# Patient Record
Sex: Male | Born: 1940 | Race: White | Hispanic: No | Marital: Married | Smoking: Former smoker
Health system: Southern US, Community
[De-identification: ages and names within clinical notes are randomized; demographics above are authoritative.]

## PROBLEM LIST (undated history)

## (undated) DIAGNOSIS — C679 Malignant neoplasm of bladder, unspecified: Secondary | ICD-10-CM

## (undated) DIAGNOSIS — M47817 Spondylosis without myelopathy or radiculopathy, lumbosacral region: Secondary | ICD-10-CM

## (undated) DIAGNOSIS — R000 Tachycardia, unspecified: Secondary | ICD-10-CM

## (undated) DIAGNOSIS — K589 Irritable bowel syndrome without diarrhea: Secondary | ICD-10-CM

## (undated) DIAGNOSIS — M51379 Other intervertebral disc degeneration, lumbosacral region without mention of lumbar back pain or lower extremity pain: Secondary | ICD-10-CM

## (undated) DIAGNOSIS — J449 Chronic obstructive pulmonary disease, unspecified: Secondary | ICD-10-CM

## (undated) DIAGNOSIS — C3490 Malignant neoplasm of unspecified part of unspecified bronchus or lung: Secondary | ICD-10-CM

## (undated) DIAGNOSIS — L409 Psoriasis, unspecified: Secondary | ICD-10-CM

## (undated) DIAGNOSIS — C4442 Squamous cell carcinoma of skin of scalp and neck: Secondary | ICD-10-CM

## (undated) DIAGNOSIS — M5020 Other cervical disc displacement, unspecified cervical region: Secondary | ICD-10-CM

## (undated) DIAGNOSIS — M5137 Other intervertebral disc degeneration, lumbosacral region: Secondary | ICD-10-CM

## (undated) DIAGNOSIS — I10 Essential (primary) hypertension: Secondary | ICD-10-CM

## (undated) DIAGNOSIS — F419 Anxiety disorder, unspecified: Secondary | ICD-10-CM

## (undated) HISTORY — DX: Other intervertebral disc degeneration, lumbosacral region: M51.37

## (undated) HISTORY — PX: SHOULDER SURGERY: SHX246

## (undated) HISTORY — DX: Psoriasis, unspecified: L40.9

## (undated) HISTORY — PX: TONSILLECTOMY: SUR1361

## (undated) HISTORY — DX: Irritable bowel syndrome, unspecified: K58.9

## (undated) HISTORY — DX: Squamous cell carcinoma of skin of scalp and neck: C44.42

## (undated) HISTORY — PX: ROTATOR CUFF REPAIR: SHX139

## (undated) HISTORY — DX: Chronic obstructive pulmonary disease, unspecified: J44.9

## (undated) HISTORY — DX: Anxiety disorder, unspecified: F41.9

## (undated) HISTORY — DX: Other cervical disc displacement, unspecified cervical region: M50.20

## (undated) HISTORY — DX: Essential (primary) hypertension: I10

## (undated) HISTORY — PX: HIP FRACTURE SURGERY: SHX118

## (undated) HISTORY — PX: ABDOMINAL AORTIC ANEURYSM REPAIR: SUR1152

## (undated) HISTORY — DX: Tachycardia, unspecified: R00.0

## (undated) HISTORY — DX: Malignant neoplasm of unspecified part of unspecified bronchus or lung: C34.90

## (undated) HISTORY — DX: Malignant neoplasm of bladder, unspecified: C67.9

## (undated) HISTORY — DX: Spondylosis without myelopathy or radiculopathy, lumbosacral region: M47.817

## (undated) HISTORY — DX: Other intervertebral disc degeneration, lumbosacral region without mention of lumbar back pain or lower extremity pain: M51.379

## (undated) HISTORY — PX: APPENDECTOMY: SHX54

---

## 1999-11-01 ENCOUNTER — Other Ambulatory Visit: Admission: RE | Admit: 1999-11-01 | Discharge: 1999-11-01 | Payer: Self-pay | Admitting: Orthopedic Surgery

## 2000-04-13 ENCOUNTER — Encounter: Payer: Self-pay | Admitting: Orthopedic Surgery

## 2000-04-13 ENCOUNTER — Ambulatory Visit (HOSPITAL_COMMUNITY): Admission: RE | Admit: 2000-04-13 | Discharge: 2000-04-13 | Payer: Self-pay | Admitting: Orthopedic Surgery

## 2000-05-07 ENCOUNTER — Encounter: Payer: Self-pay | Admitting: Specialist

## 2000-05-07 ENCOUNTER — Ambulatory Visit (HOSPITAL_COMMUNITY): Admission: RE | Admit: 2000-05-07 | Discharge: 2000-05-07 | Payer: Self-pay | Admitting: Specialist

## 2000-05-13 ENCOUNTER — Emergency Department (HOSPITAL_COMMUNITY): Admission: EM | Admit: 2000-05-13 | Discharge: 2000-05-13 | Payer: Self-pay | Admitting: Emergency Medicine

## 2000-06-22 ENCOUNTER — Encounter: Payer: Self-pay | Admitting: Orthopedic Surgery

## 2000-06-24 ENCOUNTER — Encounter: Payer: Self-pay | Admitting: Specialist

## 2000-06-24 ENCOUNTER — Encounter (INDEPENDENT_AMBULATORY_CARE_PROVIDER_SITE_OTHER): Payer: Self-pay

## 2000-06-24 ENCOUNTER — Observation Stay (HOSPITAL_COMMUNITY): Admission: RE | Admit: 2000-06-24 | Discharge: 2000-06-25 | Payer: Self-pay | Admitting: Specialist

## 2005-05-26 ENCOUNTER — Emergency Department (HOSPITAL_COMMUNITY): Admission: EM | Admit: 2005-05-26 | Discharge: 2005-05-27 | Payer: Self-pay | Admitting: Emergency Medicine

## 2008-01-05 ENCOUNTER — Ambulatory Visit: Payer: Self-pay | Admitting: Vascular Surgery

## 2008-01-26 ENCOUNTER — Encounter: Admission: RE | Admit: 2008-01-26 | Discharge: 2008-01-26 | Payer: Self-pay | Admitting: Vascular Surgery

## 2008-01-26 ENCOUNTER — Ambulatory Visit: Payer: Self-pay | Admitting: Vascular Surgery

## 2008-02-10 ENCOUNTER — Inpatient Hospital Stay (HOSPITAL_COMMUNITY): Admission: RE | Admit: 2008-02-10 | Discharge: 2008-02-11 | Payer: Self-pay | Admitting: Vascular Surgery

## 2008-02-10 ENCOUNTER — Ambulatory Visit: Payer: Self-pay | Admitting: Vascular Surgery

## 2008-02-11 ENCOUNTER — Encounter: Payer: Self-pay | Admitting: Vascular Surgery

## 2008-03-08 ENCOUNTER — Encounter: Admission: RE | Admit: 2008-03-08 | Discharge: 2008-03-08 | Payer: Self-pay | Admitting: Vascular Surgery

## 2008-03-08 ENCOUNTER — Ambulatory Visit: Payer: Self-pay | Admitting: Vascular Surgery

## 2008-04-03 ENCOUNTER — Encounter (INDEPENDENT_AMBULATORY_CARE_PROVIDER_SITE_OTHER): Payer: Self-pay | Admitting: Urology

## 2008-04-03 ENCOUNTER — Ambulatory Visit (HOSPITAL_BASED_OUTPATIENT_CLINIC_OR_DEPARTMENT_OTHER): Admission: RE | Admit: 2008-04-03 | Discharge: 2008-04-03 | Payer: Self-pay | Admitting: Urology

## 2008-05-10 ENCOUNTER — Ambulatory Visit: Payer: Self-pay | Admitting: Vascular Surgery

## 2008-05-10 ENCOUNTER — Encounter: Admission: RE | Admit: 2008-05-10 | Discharge: 2008-05-10 | Payer: Self-pay | Admitting: Vascular Surgery

## 2008-07-19 ENCOUNTER — Ambulatory Visit (HOSPITAL_COMMUNITY): Admission: RE | Admit: 2008-07-19 | Discharge: 2008-07-19 | Payer: Self-pay | Admitting: Chiropractic Medicine

## 2008-08-23 ENCOUNTER — Encounter: Admission: RE | Admit: 2008-08-23 | Discharge: 2008-08-23 | Payer: Self-pay | Admitting: Vascular Surgery

## 2009-08-22 ENCOUNTER — Ambulatory Visit: Payer: Self-pay | Admitting: Vascular Surgery

## 2009-08-22 ENCOUNTER — Encounter: Admission: RE | Admit: 2009-08-22 | Discharge: 2009-08-22 | Payer: Self-pay | Admitting: Vascular Surgery

## 2009-10-08 ENCOUNTER — Encounter: Payer: Self-pay | Admitting: Pulmonary Disease

## 2009-10-19 DIAGNOSIS — J329 Chronic sinusitis, unspecified: Secondary | ICD-10-CM | POA: Insufficient documentation

## 2009-10-19 DIAGNOSIS — C679 Malignant neoplasm of bladder, unspecified: Secondary | ICD-10-CM | POA: Insufficient documentation

## 2009-10-19 DIAGNOSIS — F411 Generalized anxiety disorder: Secondary | ICD-10-CM | POA: Insufficient documentation

## 2009-10-19 DIAGNOSIS — R222 Localized swelling, mass and lump, trunk: Secondary | ICD-10-CM | POA: Insufficient documentation

## 2009-10-19 DIAGNOSIS — G2581 Restless legs syndrome: Secondary | ICD-10-CM | POA: Insufficient documentation

## 2009-10-19 DIAGNOSIS — M5030 Other cervical disc degeneration, unspecified cervical region: Secondary | ICD-10-CM

## 2009-10-22 ENCOUNTER — Ambulatory Visit: Payer: Self-pay | Admitting: Pulmonary Disease

## 2009-10-31 ENCOUNTER — Ambulatory Visit: Payer: Self-pay | Admitting: Pulmonary Disease

## 2009-10-31 ENCOUNTER — Ambulatory Visit: Admission: RE | Admit: 2009-10-31 | Discharge: 2009-10-31 | Payer: Self-pay | Admitting: Pulmonary Disease

## 2009-11-02 ENCOUNTER — Ambulatory Visit (HOSPITAL_COMMUNITY): Admission: RE | Admit: 2009-11-02 | Discharge: 2009-11-02 | Payer: Self-pay | Admitting: Pulmonary Disease

## 2009-11-02 ENCOUNTER — Ambulatory Visit: Payer: Self-pay | Admitting: Pulmonary Disease

## 2009-11-02 DIAGNOSIS — C3410 Malignant neoplasm of upper lobe, unspecified bronchus or lung: Secondary | ICD-10-CM | POA: Insufficient documentation

## 2009-11-08 ENCOUNTER — Ambulatory Visit: Payer: Self-pay | Admitting: Pulmonary Disease

## 2009-11-08 DIAGNOSIS — J439 Emphysema, unspecified: Secondary | ICD-10-CM | POA: Insufficient documentation

## 2009-11-09 ENCOUNTER — Ambulatory Visit: Payer: Self-pay | Admitting: Internal Medicine

## 2009-11-15 ENCOUNTER — Ambulatory Visit: Payer: Self-pay | Admitting: Internal Medicine

## 2009-11-15 ENCOUNTER — Ambulatory Visit: Payer: Self-pay | Admitting: Thoracic Surgery (Cardiothoracic Vascular Surgery)

## 2009-11-16 ENCOUNTER — Telehealth (INDEPENDENT_AMBULATORY_CARE_PROVIDER_SITE_OTHER): Payer: Self-pay | Admitting: *Deleted

## 2009-12-05 ENCOUNTER — Telehealth: Payer: Self-pay | Admitting: Pulmonary Disease

## 2009-12-11 IMAGING — CT CT ANGIO PELVIS
2 of 6 series · 16 of 46 positions shown, 18 images · IV contrast ([ID] [id number])
Comparison: The CT abdomen pelvis of 01/26/2008

CTA ABDOMEN

CLINICAL DATA: Post AAA stent placement 3 weeks ago, follow-up

CT ANGIOGRAPHY ABDOMEN AND PELVIS
TECHNIQUE: Multidetector CT imaging of the abdomen and pelvis was
performed using the standard protocol during bolus administration
of intravenous contrast. Multiplanar reconstructed images including
MIPs were obtained and reviewed to evaluate the vascular anatomy.
Contrast: 100 ml [id number]

[Series 5: angio · axial · 0.72mm/px · z∈[-419,-64]mm · 13 of 211 slices shown, 15 images]
[im 11/211  soft-tissue]
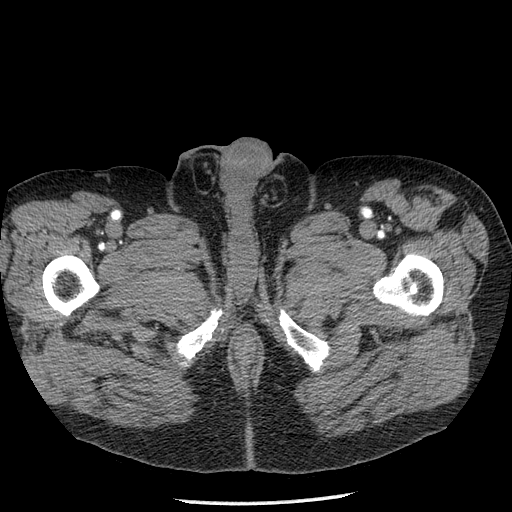
[im 11/211  bone]
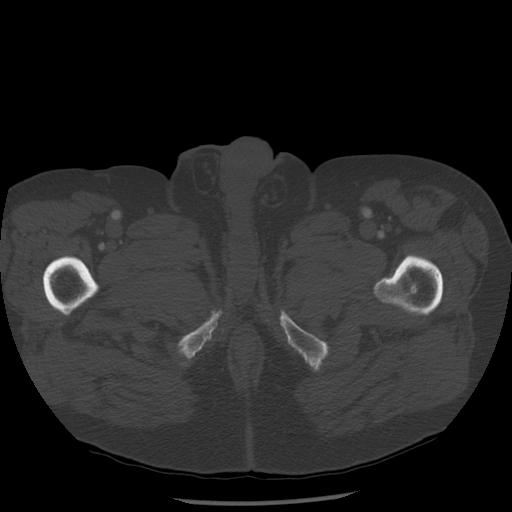
[im 32/211  soft-tissue]
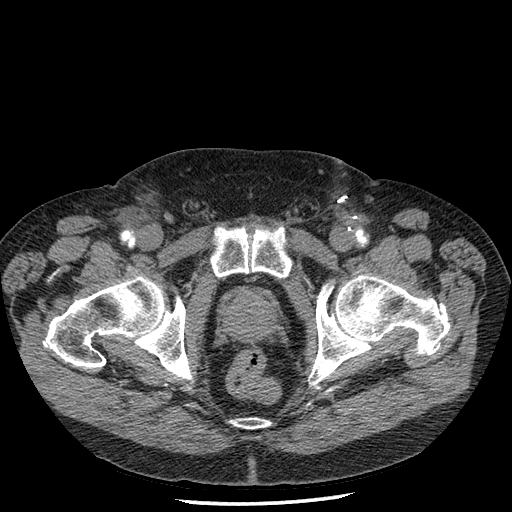
[im 43/211  soft-tissue]
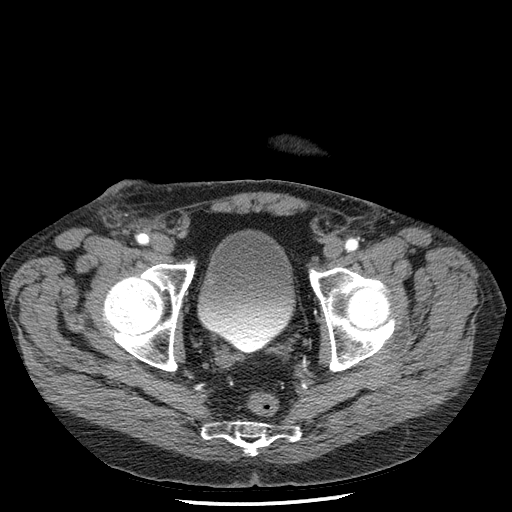
[im 64/211  soft-tissue]
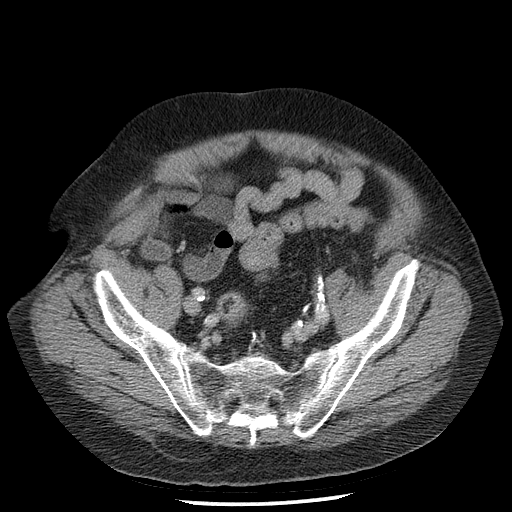
[im 74/211  soft-tissue]
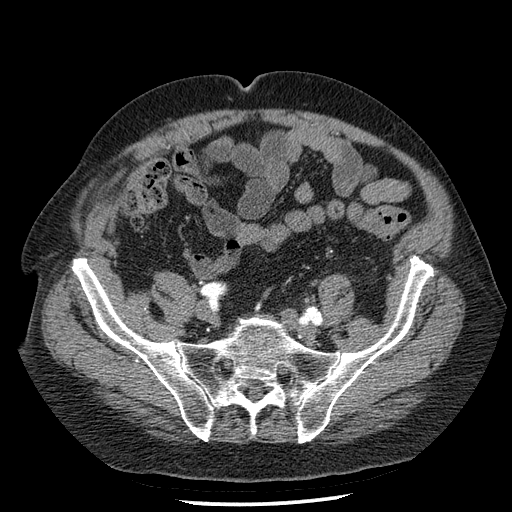
[im 95/211  soft-tissue]
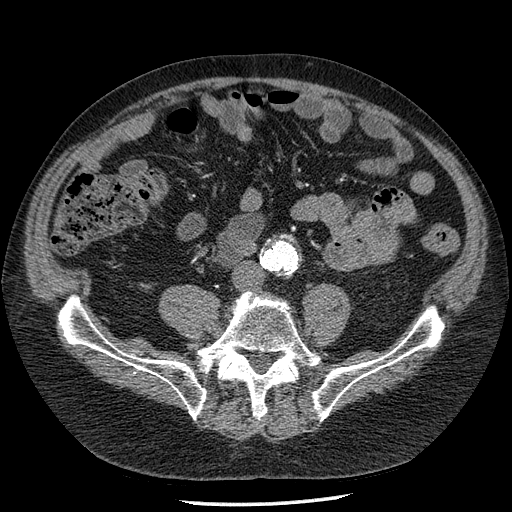
[im 106/211  soft-tissue]
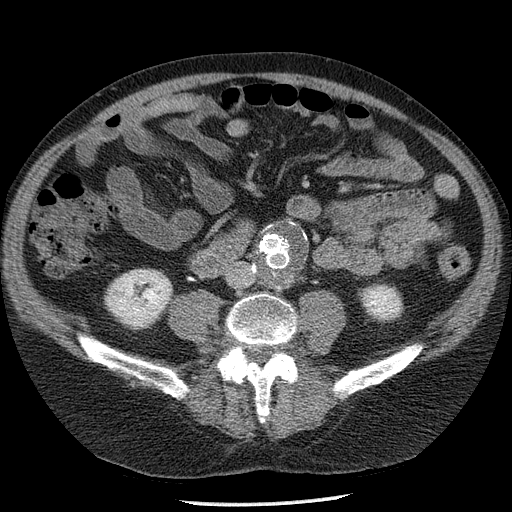
[im 116/211  soft-tissue]
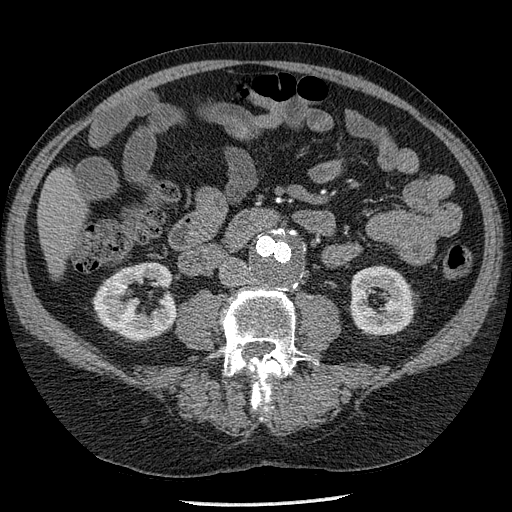
[im 137/211  soft-tissue]
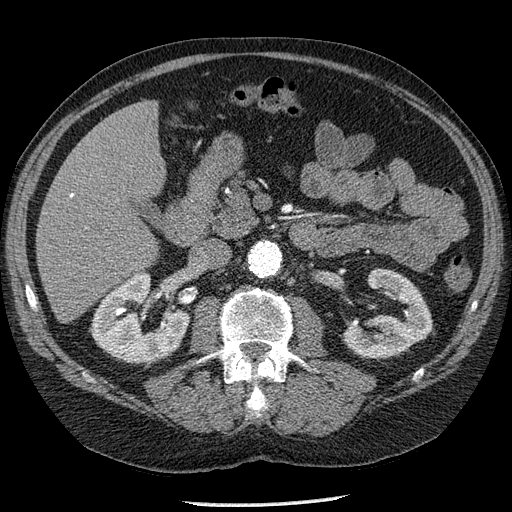
[im 137/211  bone]
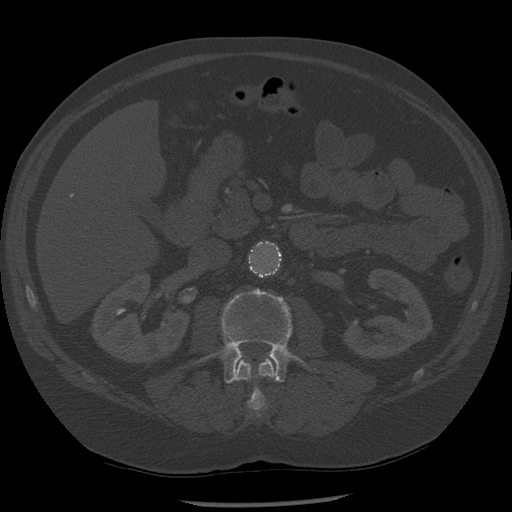
[im 148/211  soft-tissue]
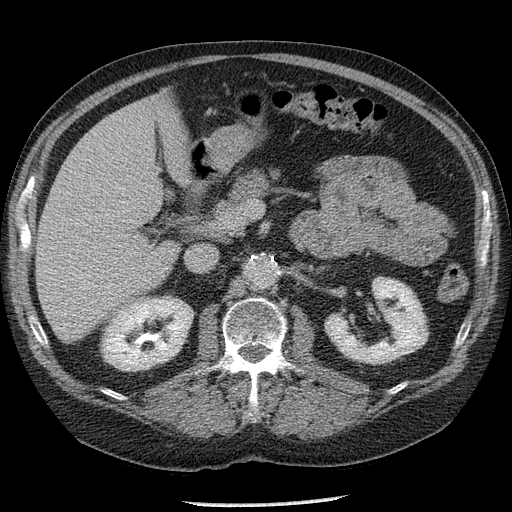
[im 169/211  soft-tissue]
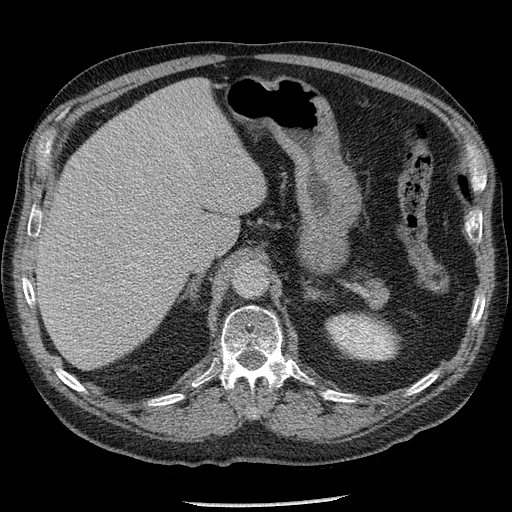
[im 179/211  soft-tissue]
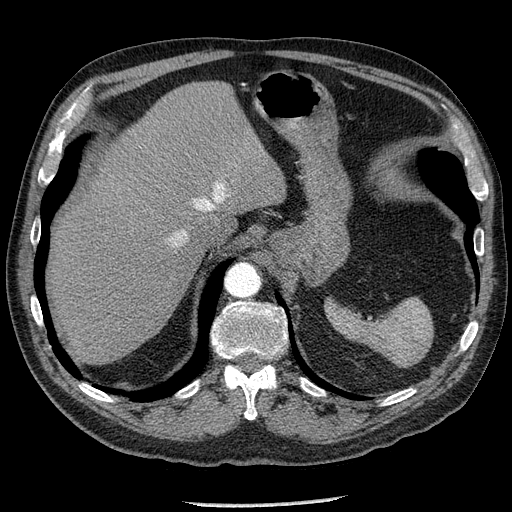
[im 200/211  soft-tissue]
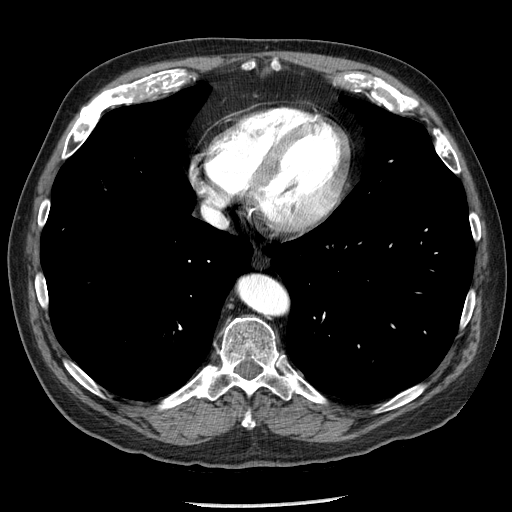

[Series 602: sagittal body · sagittal · 0.80mm/px · 3 of 149 slices shown]
[im 50/149  soft-tissue]
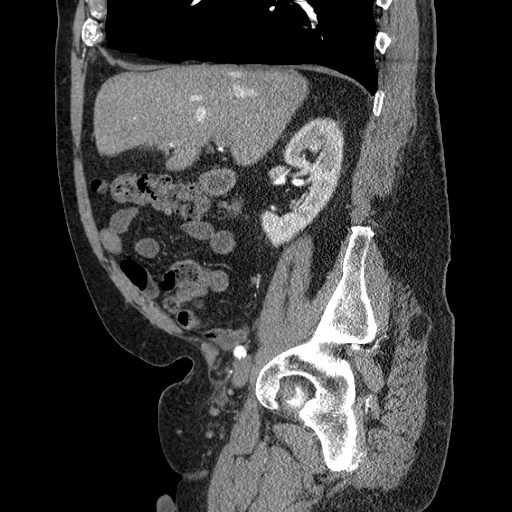
[im 66/149  soft-tissue]
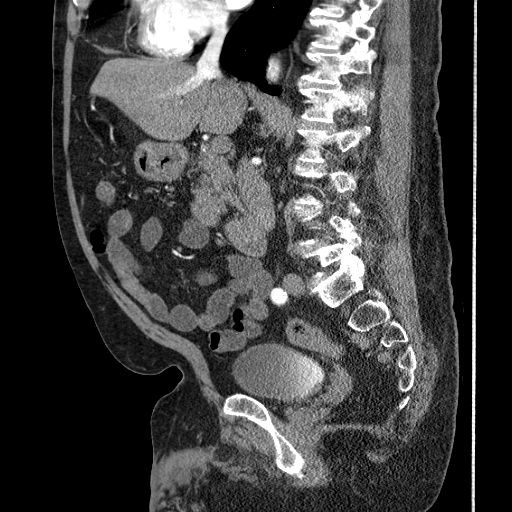
[im 83/149  soft-tissue]
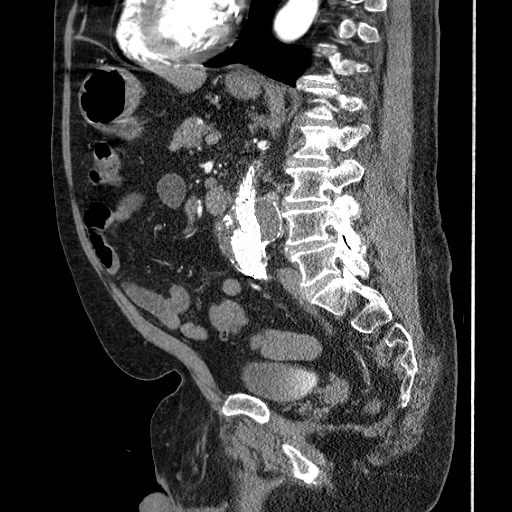

[16 of 46 positions shown; findings below may reference images not displayed]

FINDINGS: The lung bases remain clear, with minimal pleural
plaques at the right lung base abutting the hemidiaphragm
unchanged.  On the unenhanced study the AAA stent appears to be in
good position, with the proximal portion extending to a point just
beneath the origins of the renal arteries.  As noted on the prior
study there is a subtle soft tissue lesion within the right urinary
bladder worrisome for bladder carcinoma.

The abdominal aortic aneurysm sac is unchanged measuring 50 x 41
mm.  The lumen of the AAA stent opacifies with no evidence of
thrombus. On delayed images there is no evidence of endovascular
leak.

The liver enhances with no focal abnormality and no ductal
dilatation is seen.  No calcified gallstones are noted.  The
pancreas is stable in size as are the adrenal glands and the
spleen.  The kidneys enhance and the pelvocaliceal systems appear
normal.
IMPRESSION: 1.  AAA stent in good position with no evidence of endovascular
leak.
2.  The size of the AAA sac is stable measuring 50 x 41 mm in
diameter.

CTA PELVIS
FINDINGS: The iliac limbs of the AAA stent opacify and there is no
evidence of endovascular leak.  As noted on the unenhanced study
there is a soft tissue thickening in the lateral right urinary
bladder worrisome for bladder carcinoma.  Prostate is normal in
size.  Postoperative strandiness is noted in both groin, with
probable small seroma in the right anterior groin of 17 x 33 mm..
IMPRESSION: 1. The iliac limbs of the AAA stent opacify with no evidence of
endovascular leak.

 2.  No change in focal thickening of the wall of the right lateral
urinary bladder worrisome for bladder carcinoma.

## 2010-01-07 ENCOUNTER — Ambulatory Visit: Payer: Self-pay | Admitting: Pulmonary Disease

## 2010-02-07 ENCOUNTER — Telehealth: Payer: Self-pay | Admitting: Pulmonary Disease

## 2010-07-02 NOTE — Assessment & Plan Note (Signed)
Summary: ov to discuss bronch results/mg   Copy to:  Foye Deer Primary Provider/Referring Provider:  Foye Deer  CC:  Pt is here for a f/u appt to discuss bronch results. .  History of Present Illness: the pt comes in today for f/u of bronchoscopy results.  He was found to have no endobronchial lesion, but the specimens obtained under fluoro of the density in question were positive for nonsmall cell cancer.  I have reviewed the results with him, and answered all of his questions.  Medications Prior to Update: 1)  Benazepril Hcl 20 Mg Tabs (Benazepril Hcl) .... 1/2 Two Times A Day 2)  Hydrochlorothiazide 25 Mg Tabs (Hydrochlorothiazide) .Marland Kitchen.. 1 Once Daily As Needed 3)  Diltiazem Hcl Coated Beads 240 Mg Xr24h-Tab (Diltiazem Hcl Coated Beads) .Marland Kitchen.. 1 Once Daily 4)  Proair Hfa 108 (90 Base) Mcg/act Aers (Albuterol Sulfate) .... 2 Puffs Every 4-6 Hrs As Needed 5)  Vitamin E 400 Unit Caps (Vitamin E) .Marland Kitchen.. 1 Once Daily 6)  B Complex 150 Tabs (B Complex Vitamins) .... Take 1 Tablet By Mouth Once A Day 7)  Alprazolam 0.5 Mg Tabs (Alprazolam) .Marland Kitchen.. 1 Four Times A Day As Needed 8)  Hydrocodone-Acetaminophen 10-660 Mg Tabs (Hydrocodone-Acetaminophen) .Marland Kitchen.. 1 Every 6 Hours As Needed 9)  Spiriva Handihaler 18 Mcg  Caps (Tiotropium Bromide Monohydrate) .... Two Puffs in Handihaler Daily 10)  Metoprolol Tartrate 50 Mg Tabs (Metoprolol Tartrate) .... Take 1 Tablet By Mouth Two Times A Day 11)  Vitamin D 400 Unit Caps (Cholecalciferol) .... Take 1 Tablet By Mouth Once A Day 12)  Vitamin C 1000 Mg Tabs (Ascorbic Acid) .... Take 1 Tablet By Mouth Once A Day 13)  Garlic Oil 1000 Mg Caps (Garlic) .... Take 1 Tablet By Mouth Once A Day 14)  Black Cherry 250mg  .... Take 1 Tablet By Mouth Once A Day 15)  Selenium 200 Mcg Tabs (Selenium) .... Take 1 Tablet By Mouth Once A Day 16)  Multivitamins  Tabs (Multiple Vitamin) .... Take 1 Tablet By Mouth Once A Day 17)  Fish Oil 1000 Mg Caps (Omega-3 Fatty Acids)  .... Take 1 Tablet By Mouth Once A Day  Allergies (verified): No Known Drug Allergies  Vital Signs:  Patient profile:   70 year old male Weight:      177 pounds O2 Sat:      95 % on Room air Temp:     98.5 degrees F oral Pulse rate:   90 / minute BP sitting:   152 / 90  (left arm) Cuff size:   regular  Vitals Entered By: Arman Filter LPN (November 03, 8411 10:35 AM)  O2 Flow:  Room air CC: Pt is here for a f/u appt to discuss bronch results.  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  There is no obvious metastatic disease by ct chest, but he will need a PET scan for evaluation.  The question may be raised whether he is a surgical candidate, but I have my doubts based on his underlying lung disease.  He needs pfts for evaluation, and obviously needs to quit smoking.  Will see him back once the PET and pfts are done.  Time  spent with pt and wife discussing the above was .  Other Orders: Est. Patient Level III ([address]) Radiology Referral (Radiology) Pulmonary Referral (Pulmonary)  Patient Instructions: 1)  will schedule for breathing tests and PET scan.  Will call you with results when available.

## 2010-07-02 NOTE — Progress Notes (Signed)
Summary: taking med wrong  Phone Note Call from Patient Call back at Home Phone [phone]   Caller: Patient Call For: clance Reason for Call: Talk to Nurse Summary of Call: pt was told to dbl up in the morning on his inhaler Symbicort, but pt is getting to where he can't brath as deep.  He has been taking Spiriva two times a day one in the am one in pm.  I told him that was incorrect that Spiriva was once a day.  We went over instructions from last visit.  Please call pt to make sure he understands this.  He said he was still sob and didn't think meds were helping and I pointed out gently that he had been taking them incorrectly. Initial call taken by: Eugene Gavia,  November 16, 2009 2:26 PM  Follow-up for Phone Call        Spoke with pt.  I went over his last instrcutions from ov and reminded him to only use spiriva 1 cap per day- inhale 2 puffs from the 1 capsule to be sure that he gets all of the medicine out.  Also I advised that he needs to take the symbicort 2 puffs two times a day. He states that he was doing 2 puffs at 1 time.  I advised that he do 1 puff at a time.  Pt verbalized understanding. Follow-up by: Vernie Murders,  November 16, 2009 2:41 PM

## 2010-07-02 NOTE — Progress Notes (Signed)
Summary: foradil rx   Phone Note Call from Patient   Caller: Patient Call For: clance Summary of Call: pt would like to no if he need to stay on foradil if so he need prescript sent to [facility]. [facility] [phone] Initial call taken by: Rickard Patience,  February 07, 2010 1:24 PM  Follow-up for Phone Call        saw [facility] on 8.8.11, was swtiched from symbircot to foradil as trial.  has this helped his breathing?  LMOM TCB. Boone Master CNA/MA  February 07, 2010 2:41 PM   LMTCBx2. Carron Curie CMA  February 08, 2010 9:21 AM   Additional Follow-up for Phone Call Additional follow up Details #1::        Spoke with pt.  He states that he has not noticed any improvement on foradil.  He states that he is still SOB and med is "tearing up my throat"- throat feels raw and has diff w/ swallowing.  He states that his chest feels sore also.  Does not know if this is coming from the foradil or the RT.  Pls advise thanks! Additional Follow-up by: Vernie Murders,  February 08, 2010 9:53 AM    Additional Follow-up for Phone Call Additional follow up Details #2::    if the foradil is not helping, no need to stay on it.  can d/c and just stay on spiriva alone.  Remind him that benezapril can also bother his throat, and I have sent the suggestion to discontinue to his primary md. Follow-up by: Barbaraann Share MD,  February 08, 2010 5:44 PM  Additional Follow-up for Phone Call Additional follow up Details #3:: Details for Additional Follow-up Action Taken: pt advised. Carron Curie CMA  February 11, 2010 8:58 AM

## 2010-07-02 NOTE — Progress Notes (Signed)
Summary: medication  Phone Note Call from Patient   Caller: Patient Call For: clance Summary of Call: pt calling about dose for symbicort Initial call taken by: Rickard Patience,  December 05, 2009 2:40 PM  Follow-up for Phone Call        called spoke with patient who states that he had to decrease his symbicort use to 1 puf two times a day d/t increased difficulty w/ deep inhalation.  pt states that he did this 3 days ago and his breathing has since improved a great deal.  pt states he just wanted to run this by [facility] for "his thoughts."  -note:  pt states he has not smoked any cigarettes x2weeks. Follow-up by: Boone Master CNA/MA,  December 05, 2009 3:03 PM  Additional Follow-up for Phone Call Additional follow up Details #1::        If he can't deeply inhale, then no reason to smoke anymore.  let him know the dose of symbicort is 2 puffs am and pm...if he doesn't want to do this, then he won't get benefit of the medication.  Let him know I think it is great that he has not smoked....this may be the reason his breathing is better the past 3 days. Additional Follow-up by: Barbaraann Share MD,  December 05, 2009 5:02 PM    Additional Follow-up for Phone Call Additional follow up Details #2::    called and spoke with pt about the symbicort per [facility] recs--he stated that he has a hard time breathing when he does this but he understands what [facility] is saying...he started his chemo yesterday. Randell Loop CMA  December 05, 2009 5:32 PM

## 2010-07-02 NOTE — Assessment & Plan Note (Signed)
Summary: rov to discuss pet and pft results.   Primary Provider/Referring Provider:  Foye Rosario  CC:  Pt is here for a f/u appt to discuss PFT reults and treatment options. .  History of Present Illness: The pt comes in today for f/u of his PET and pfts results.  He was found to have hypermetabolic activity in his LUL mass, but really nowhere else.  There was some mild increased activity in the left hilum, but no anatomic abnormality that corresponded to that area.  His pfts though show severe airflow obstruction, but he did have a 23% improvement in FEV1 with bronchodilators.  Even with the improvement, he still has moderate to severe disease.  I have discussed the above test results with him in detail, and answered all of his questions.  Medications Prior to Update: 1)  Benazepril Hcl 20 Mg Tabs (Benazepril Hcl) .... 1/2 Two Times A Day 2)  Hydrochlorothiazide 25 Mg Tabs (Hydrochlorothiazide) .Marland Kitchen.. 1 Once Daily As Needed 3)  Diltiazem Hcl Coated Beads 240 Mg Xr24h-Tab (Diltiazem Hcl Coated Beads) .Marland Kitchen.. 1 Once Daily 4)  Proair Hfa 108 (90 Base) Mcg/act Aers (Albuterol Sulfate) .... 2 Puffs Every 4-6 Hrs As Needed 5)  Vitamin E 400 Unit Caps (Vitamin E) .Marland Kitchen.. 1 Once Daily 6)  B Complex 150 Tabs (B Complex Vitamins) .... Take 1 Tablet By Mouth Once A Day 7)  Alprazolam 0.5 Mg Tabs (Alprazolam) .Marland Kitchen.. 1 Four Times A Day As Needed 8)  Hydrocodone-Acetaminophen 10-660 Mg Tabs (Hydrocodone-Acetaminophen) .Marland Kitchen.. 1 Every 6 Hours As Needed 9)  Spiriva Handihaler 18 Mcg  Caps (Tiotropium Bromide Monohydrate) .... Two Puffs in Handihaler Daily 10)  Metoprolol Tartrate 50 Mg Tabs (Metoprolol Tartrate) .... Take 1 Tablet By Mouth Two Times A Day 11)  Vitamin D 400 Unit Caps (Cholecalciferol) .... Take 1 Tablet By Mouth Once A Day 12)  Vitamin C 1000 Mg Tabs (Ascorbic Acid) .... Take 1 Tablet By Mouth Once A Day 13)  Garlic Oil 1000 Mg Caps (Garlic) .... Take 1 Tablet By Mouth Once A Day 14)  Black Cherry  250mg  .... Take 1 Tablet By Mouth Once A Day 15)  Selenium 200 Mcg Tabs (Selenium) .... Take 1 Tablet By Mouth Once A Day 16)  Multivitamins  Tabs (Multiple Vitamin) .... Take 1 Tablet By Mouth Once A Day 17)  Fish Oil 1000 Mg Caps (Omega-3 Fatty Acids) .... Take 1 Tablet By Mouth Once A Day  Allergies (verified): No Known Drug Allergies  Review of Systems  The patient denies shortness of breath with activity, shortness of breath at rest, productive cough, non-productive cough, coughing up blood, chest pain, irregular heartbeats, acid heartburn, indigestion, loss of appetite, weight change, abdominal pain, difficulty swallowing, sore throat, tooth/dental problems, headaches, nasal congestion/difficulty breathing through nose, sneezing, itching, ear ache, anxiety, depression, hand/feet swelling, joint stiffness or pain, rash, change in color of mucus, and fever.    Vital Signs:  Patient profile:   70 year old male Weight:      180 pounds O2 Sat:      94 % on Room air Temp:     98.7 degrees F oral Pulse rate:   98 / minute BP sitting:   164 / 82  (left arm) Cuff size:   regular  Vitals Entered By: Arman Filter LPN (November 09, 930 2:50 PM)  O2 Flow:  Room air CC: Pt is here for a f/u appt to discuss PFT reults and treatment options.  Comments Medications  reviewed with patient Arman Filter LPN  November 08, 1608 2:50 PM    Physical Exam  General:  wd male in nad   Impression & Recommendations:  Problem # 1:  EMPHYSEMA (ICD-492.8) the pt has severe emphysema by pfts, but a very good response to bronchodilators.  This tells me that he may improve with a more aggressive bronchodilator regimen and total smoking cessation.  Will add symbicort to his spiriva, and again urged him to totally quit smoking.  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  I do not think he is a surgical candidate at this time, but may be in the future if he totally quits smoking and stays on his meds.  Perhaps he can get xrt/chemo, and possibly surgery down the road if needed?  Will refer to [facility] for evaluation/treatment.  Time spent with pt and wife today was .  Medications Added to Medication List This Visit: 1)  Symbicort 160-4.5 Mcg/act Aero (Budesonide-formoterol fumarate) .... Two puffs twice daily  Other Orders: Prescription Created Electronically [id number]) Est. Patient Level IV (40981) Misc. Referral (Misc. Ref)  Patient Instructions: 1)  continue spiriva 2)  start symbicort 160/4.5  2 puffs am and pm...rinse mouth out 3)  STOP SMOKING. 4)  will refer you to cancer clinic for evaluation. 5)  followup with me in 2mos.  Prescriptions: SYMBICORT 160-4.5 MCG/ACT  AERO (BUDESONIDE-FORMOTEROL FUMARATE) Two puffs twice daily  #1 x 6   Entered and Authorized by:   Barbaraann Share MD   Signed by:   Barbaraann Share MD on 11/08/2009   Method used:   Electronically to        [facility]  [address] [address] [phone]* (retail)       [address]       [place], [place]  [address]       Ph: [phone] or [phone]       Fax: [phone]   RxID:   [id number]

## 2010-07-02 NOTE — Assessment & Plan Note (Signed)
Summary: consult for lung mass   Copy to:  Foye Deer Primary Provider/Referring Provider:  Foye Deer  CC:  Pulmonary Consult.  History of Present Illness: the patient is a 70 year old male who I've been asked to see for an abnormal CT chest. The patient began to have sharp left anterior chest pain, and underwent a chest x-ray which showed a left suprahilar abnormality. He subsequently underwent CT chest, which showed a 3.3 cm left upper lobe lesion but no significant mediastinal or hilar lymphadenopathy. The patient is eating well, and has not had any significant weight loss. He has a mild chronic cough but no hemoptysis noted. He denies significant chest congestion. He does have a history of one block dyspnea on exertion at a moderate pace on flat ground, and will get winded bringing groceries in from the car. He has a very long history of tobacco abuse, and is still smoking at this time. He also has a history of bladder cancer, but this is felt to be localized and treated with what sounds like a fulguration procedure.  The pt has never had pfts for documentation of airflow obstruction.  Current Medications (verified): 1)  Benazepril Hcl 20 Mg Tabs (Benazepril Hcl) .... 1/2 Two Times A Day 2)  Hydrochlorothiazide 25 Mg Tabs (Hydrochlorothiazide) .Marland Kitchen.. 1 Once Daily As Needed 3)  Diltiazem Hcl Coated Beads 240 Mg Xr24h-Tab (Diltiazem Hcl Coated Beads) .Marland Kitchen.. 1 Once Daily 4)  Proair Hfa 108 (90 Base) Mcg/act Aers (Albuterol Sulfate) .... 2 Puffs Every 4-6 Hrs As Needed 5)  Vitamin E 400 Unit Caps (Vitamin E) .Marland Kitchen.. 1 Once Daily 6)  B Complex 150 Tabs (B Complex Vitamins) .... Take 1 Tablet By Mouth Once A Day 7)  Alprazolam 0.5 Mg Tabs (Alprazolam) .Marland Kitchen.. 1 Four Times A Day As Needed 8)  Hydrocodone-Acetaminophen 10-660 Mg Tabs (Hydrocodone-Acetaminophen) .Marland Kitchen.. 1 Every 6 Hours As Needed 9)  Spiriva Handihaler 18 Mcg  Caps (Tiotropium Bromide Monohydrate) .... Two Puffs in Handihaler Daily 10)   Metoprolol Tartrate 50 Mg Tabs (Metoprolol Tartrate) .... Take 1 Tablet By Mouth Two Times A Day 11)  Vitamin D 400 Unit Caps (Cholecalciferol) .... Take 1 Tablet By Mouth Once A Day 12)  Vitamin C 1000 Mg Tabs (Ascorbic Acid) .... Take 1 Tablet By Mouth Once A Day 13)  Garlic Oil 1000 Mg Caps (Garlic) .... Take 1 Tablet By Mouth Once A Day 14)  Black Cherry 250mg  .... Take 1 Tablet By Mouth Once A Day 15)  Selenium 200 Mcg Tabs (Selenium) .... Take 1 Tablet By Mouth Once A Day 16)  Multivitamins  Tabs (Multiple Vitamin) .... Take 1 Tablet By Mouth Once A Day 17)  Fish Oil 1000 Mg Caps (Omega-3 Fatty Acids) .... Take 1 Tablet By Mouth Once A Day  Allergies (verified): No Known Drug Allergies  Past History:  Past Medical History: Hypertension DEGENERATIVE DISC DISEASE, CERVICAL SPINE (ICD-722.4) SINUSITIS, CHRONIC (ICD-473.9) ANXIETY (ICD-300.00) MALIGNANT NEOPLASM OF BLADDER PART UNSPECIFIED (ICD-188.9) RESTLESS LEGS SYNDROME (ICD-333.94)    Past Surgical History: Appendectomy Rotator Cuff Repair (right) Tonsillectomy Shoulder surgery (left) x 2  Hip fx and repair (right) AAA repair  2009 B carpal tunnel surgery trigger finger surgery R knee surgery  Family History: Reviewed history from 10/19/2009 and no changes required. Heart dz- Mother (hx of MI) Leukemia- Brother  Social History: Reviewed history from 10/19/2009 and no changes required. Current smoker.  started at age 73.  1 1/2 ppd.  now has cut back to  less  than 1/2 ppd. pt is married. pt has children. pt is retired.   prev worked at [employer]   Review of Systems       The patient complains of shortness of breath with activity, irregular heartbeats, and hand/feet swelling.  The patient denies shortness of breath at rest, productive cough, non-productive cough, coughing up blood, chest pain, acid heartburn, indigestion, loss of appetite, weight change, abdominal pain, difficulty swallowing, sore throat,  tooth/dental problems, headaches, nasal congestion/difficulty breathing through nose, sneezing, itching, ear ache, anxiety, depression, joint stiffness or pain, rash, change in color of mucus, and fever.    Vital Signs:  Patient profile:   70 year old male Weight:      181 pounds O2 Sat:      91 % on Room air Temp:     98.3 degrees F oral Pulse rate:   116 / minute BP sitting:   118 / 70  (left arm) Cuff size:   regular  Vitals Entered By: Arman Filter LPN (Oct 22, 2009 10:04 AM)  O2 Flow:  Room air CC: Pulmonary Consult Comments Medications reviewed with patient Arman Filter LPN  Oct 22, 2009 10:04 AM    Physical Exam  General:  ow male in nad Eyes:  PERRLA and EOMI.   Nose:  patent without discharge Mouth:  clear Neck:  no jvd, tmg, LN Lungs:  decreased  bs throughout, no wheezing or rhonchi Heart:  rrr, no mrg Abdomen:  soft and nontender, bs+ Extremities:  3+ edema bilat, no cyanosis difficult to palpate pulses due to edema Neurologic:  alert and oriented, moves all 4.   Impression & Recommendations:  Problem # 1:  MASS, LUNG (ICD-786.6)  the patient has a 3 cm mass in the left upper lobe that is very suspicious for bronchogenic cancer. Unfortunately, is in a very difficult location, and will be difficult to biopsy either through transthoracic needle aspiration or bronchoscopy. I am hesitant to do a needle biopsy given the large amount of lung the needle would have to pass, almost certainly resulting in a pneumothorax. I think there is a reasonable chance of diagnosis with bronchoscopy and fluoroscopy. The other approach would be to proceed directly toward thoracoscopic biopsy and resection, but I am concerned about his underlying lung disease and ability to tolerate surgery. I would like to start with bronchoscopy to see if we can obtain a diagnosis. If we are unsuccessful, would proceed with PET scan and full pulmonary function studies to look at his resectability.  The patient is agreeable to this approach. He understands the risk associated with bronchoscopy, including respiratory distress, bleeding, and pneumothorax.  Medications Added to Medication List This Visit: 1)  B Complex 150 Tabs (b Complex Vitamins)  .... Take 1 tablet by mouth once a day 2)  Spiriva Handihaler 18 Mcg Caps (Tiotropium bromide monohydrate) .... Two puffs in handihaler daily 3)  Metoprolol Tartrate 50 Mg Tabs (Metoprolol tartrate) .... Take 1 tablet by mouth two times a day 4)  Vitamin D 400 Unit Caps (Cholecalciferol) .... Take 1 tablet by mouth once a day 5)  Vitamin C 1000 Mg Tabs (Ascorbic acid) .... Take 1 tablet by mouth once a day 6)  Garlic Oil 1000 Mg Caps (Garlic) .... Take 1 tablet by mouth once a day 7)  Black Cherry 250mg   .... Take 1 tablet by mouth once a day 8)  Selenium 200 Mcg Tabs (Selenium) .... Take 1 tablet by mouth once a day 9)  Multivitamins  Tabs (Multiple vitamin) .... Take 1 tablet by mouth once a day 10)  Fish Oil 1000 Mg Caps (Omega-3 fatty acids) .... Take 1 tablet by mouth once a day  Other Orders: Consultation Level IV (60454)  Patient Instructions: 1)  will set up for biopsy on your lung growth.  Nothing to eat or drink after midnight except one sip of water to take essential medication.  No aspirin or advil 2 days before the procedure. 2)  will arrange followup to review results once available.

## 2010-07-02 NOTE — Assessment & Plan Note (Signed)
Summary: rov for emphysema, lung cancer   Copy to:  Foye Deer Primary Jessenia Filippone/Referring Moriyah Byington:  Foye Deer  CC:  Pt is here for a f/u appt.  Pt states Symbicort causes increased sob.  Pt also c/o increased non-productive cough esp at night.  Pt has quit smoking. Pt c/o sore spot on back d/t radiation. Marland Kitchen  History of Present Illness: the pt comes in today for f/u of his known emphysema.  He has also been receiving xrt for his recently diagnosed lung cancer.  He was unable to stay on symbicort, due to "irritation" to his breathing.  He has stayed on spiriva alone, and has quit smoking.  He continues to have a cough at night, but also is still on an ACE inhibitor.  He has not had any purulence.  Current Medications (verified): 1)  Benazepril Hcl 20 Mg Tabs (Benazepril Hcl) .... 1/2 Two Times A Day 2)  Hydrochlorothiazide 25 Mg Tabs (Hydrochlorothiazide) .Marland Kitchen.. 1 Once Daily As Needed 3)  Diltiazem Hcl Coated Beads 240 Mg Xr24h-Tab (Diltiazem Hcl Coated Beads) .Marland Kitchen.. 1 Once Daily 4)  Proair Hfa 108 (90 Base) Mcg/act Aers (Albuterol Sulfate) .... 2 Puffs Every 4-6 Hrs As Needed 5)  Vitamin E 400 Unit Caps (Vitamin E) .Marland Kitchen.. 1 Once Daily 6)  B Complex 150 Tabs (B Complex Vitamins) .... Take 1 Tablet By Mouth Once A Day 7)  Alprazolam 0.5 Mg Tabs (Alprazolam) .Marland Kitchen.. 1 Four Times A Day As Needed 8)  Hydrocodone-Acetaminophen 10-660 Mg Tabs (Hydrocodone-Acetaminophen) .Marland Kitchen.. 1 Every 6 Hours As Needed 9)  Spiriva Handihaler 18 Mcg  Caps (Tiotropium Bromide Monohydrate) .... Two Puffs in Handihaler Daily 10)  Metoprolol Tartrate 50 Mg Tabs (Metoprolol Tartrate) .... Take 1 Tablet By Mouth Two Times A Day 11)  Vitamin D 400 Unit Caps (Cholecalciferol) .... Take 1 Tablet By Mouth Once A Day 12)  Vitamin C 1000 Mg Tabs (Ascorbic Acid) .... Take 1 Tablet By Mouth Once A Day 13)  Garlic Oil 1000 Mg Caps (Garlic) .... Take 1 Tablet By Mouth Once A Day 14)  Black Cherry 250mg  .... Take 1 Tablet By Mouth  Once A Day 15)  Selenium 200 Mcg Tabs (Selenium) .... Take 1 Tablet By Mouth Once A Day 16)  Multivitamins  Tabs (Multiple Vitamin) .... Take 1 Tablet By Mouth Once A Day 17)  Fish Oil 1000 Mg Caps (Omega-3 Fatty Acids) .... Take 1 Tablet By Mouth Once A Day 18)  Symbicort 160-4.5 Mcg/act  Aero (Budesonide-Formoterol Fumarate) .... Two Puffs Twice Daily 19)  Coq10 .... Take By Mouth Daily  Allergies (verified): No Known Drug Allergies  Review of Systems       The patient complains of shortness of breath with activity and non-productive cough.  The patient denies shortness of breath at rest, productive cough, coughing up blood, chest pain, irregular heartbeats, acid heartburn, indigestion, loss of appetite, weight change, abdominal pain, difficulty swallowing, sore throat, tooth/dental problems, headaches, nasal congestion/difficulty breathing through nose, sneezing, itching, ear ache, anxiety, depression, hand/feet swelling, joint stiffness or pain, rash, change in color of mucus, and fever.    Vital Signs:  Patient profile:   70 year old male Weight:      198.38 pounds O2 Sat:      96 % on Room air Temp:     98.3 degrees F oral Pulse rate:   99 / minute BP sitting:   128 / 72  (left arm) Cuff size:   regular  Vitals Entered By:  Aundra Millet Reynolds LPN (January 07, 2010 70)  O2 Flow:  Room air CC: Pt is here for a f/u appt.  Pt states Symbicort causes increased sob.  Pt also c/o increased non-productive cough esp at night.  Pt has quit smoking. Pt c/o sore spot on back d/t radiation.  Comments Medications reviewed with patient Arman Filter LPN  January 07, 2010 2:08 PM    Physical Exam  General:  39 male in nad Lungs:  decreased bs throughout with a few scattered crackles. Heart:  rrr, no mrg Extremities:  mild edema, no cyanosis  Neurologic:  alert and oriented, moves all 4.   Impression & Recommendations:  Problem # 1:  EMPHYSEMA (ICD-492.8) the pt felt symbicort  worsened his breathing and is on spiriva alone.  I would like to maximize his bronchodilator regimen, and now that he has quit smoking, has less need for ICS.  Will try adding foradil alone to his spiriva and see if things improve.  I have also asked him to work on weight loss and some type of exercise program.  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  Medications Added to Medication List This Visit: 1)  Coq10  .... Take by mouth daily  Other Orders: Est. Patient Level III (16109)  Patient Instructions: 1)  continue on spiriva 2)  stop symbicort, start foradil one inhalation in am and pm.  please call if you think it is helping so I can call in a prescription.  If not helping, stop the foradil. 3)  work on some kind of exercise if able. 4)  will send a note to Dr. Tomasa Blase to consider gettting you off benazepril due to your coughing. 5)  followup with me in 4mos.

## 2010-07-12 ENCOUNTER — Encounter (INDEPENDENT_AMBULATORY_CARE_PROVIDER_SITE_OTHER): Payer: MEDICARE

## 2010-07-12 DIAGNOSIS — I714 Abdominal aortic aneurysm, without rupture, unspecified: Secondary | ICD-10-CM

## 2010-07-12 DIAGNOSIS — Z48812 Encounter for surgical aftercare following surgery on the circulatory system: Secondary | ICD-10-CM

## 2010-07-26 NOTE — Procedures (Unsigned)
VASCULAR LAB EXAM  INDICATION:  Followup abdominal aortic aneurysm endograft placed on 02/21/2008.  HISTORY: Diabetes:  No. Cardiac:  No. Hypertension:  Yes.  EXAM:  AAA sac size today is 3.6 cm AP, transverse is 4.2 cm.  Previous sac size was 3.8 cm AP, 4.3 cm transverse by CT performed 08/22/2009.  IMPRESSION:  The aorta and endograft appear patent.  No significant change in size of the aneurysm sac surrounding the graft.  No evidence of endoleak was detected.  Technically difficult study due to overlying bowel gas and patient body habitus.  ___________________________________________ Janetta Hora. Fields, MD  OD/MEDQ  D:  07/12/2010  T:  07/12/2010  Job:  [id number]

## 2010-07-29 ENCOUNTER — Ambulatory Visit (HOSPITAL_BASED_OUTPATIENT_CLINIC_OR_DEPARTMENT_OTHER)
Admission: RE | Admit: 2010-07-29 | Discharge: 2010-07-29 | Disposition: A | Discharge status: Home / Self Care | Payer: MEDICARE | Attending: Urology | Admitting: Urology

## 2010-07-29 ENCOUNTER — Other Ambulatory Visit: Payer: Self-pay | Admitting: Urology

## 2010-07-29 DIAGNOSIS — I10 Essential (primary) hypertension: Secondary | ICD-10-CM | POA: Insufficient documentation

## 2010-07-29 DIAGNOSIS — Z85118 Personal history of other malignant neoplasm of bronchus and lung: Secondary | ICD-10-CM | POA: Insufficient documentation

## 2010-07-29 DIAGNOSIS — Z01810 Encounter for preprocedural cardiovascular examination: Secondary | ICD-10-CM | POA: Insufficient documentation

## 2010-07-29 DIAGNOSIS — F17200 Nicotine dependence, unspecified, uncomplicated: Secondary | ICD-10-CM | POA: Insufficient documentation

## 2010-07-29 DIAGNOSIS — Z01812 Encounter for preprocedural laboratory examination: Secondary | ICD-10-CM | POA: Insufficient documentation

## 2010-07-29 DIAGNOSIS — C672 Malignant neoplasm of lateral wall of bladder: Secondary | ICD-10-CM | POA: Insufficient documentation

## 2010-07-29 DIAGNOSIS — J449 Chronic obstructive pulmonary disease, unspecified: Secondary | ICD-10-CM | POA: Insufficient documentation

## 2010-07-29 DIAGNOSIS — J4489 Other specified chronic obstructive pulmonary disease: Secondary | ICD-10-CM | POA: Insufficient documentation

## 2010-07-29 LAB — POCT I-STAT 4, (NA,K, GLUC, HGB,HCT)
Glucose, Bld: 113 mg/dL — ABNORMAL HIGH (ref 70–99)
Potassium: 4 mEq/L (ref 3.5–5.1)
Sodium: 139 mEq/L (ref 135–145)

## 2010-08-09 NOTE — Op Note (Signed)
  NAMEMATHHEW, BUYSSE                  ACCOUNT NO.:  [id number]  MEDICAL RECORD NO.:  [id number]          PATIENT TYPE:  AMB  LOCATION:  [facility]                         FACILITY:  [facility]  PHYSICIAN:  Valetta Fuller, M.D.  DATE OF BIRTH:  June 03, 1940  DATE OF PROCEDURE: DATE OF DISCHARGE:                              OPERATIVE REPORT   PREOPERATIVE DIAGNOSIS:  Recurrent transitional cell carcinoma of the bladder.  POSTOPERATIVE DIAGNOSIS:  Recurrent transitional cell carcinoma of the bladder.  PROCEDURE PERFORMED:  Cystoscopy with cold-cup resection of 2 small bladder tumors and fulguration.  SURGEON:  Valetta Fuller, M.D.  ANESTHESIA:  General.  INDICATIONS:  Joe Rosario underwent a transurethral resection of bladder tumor in November 2009.  Tumor at that time was low grade and noninvasive and was solitary.  The patient did have mitomycin instillation of that time. He has had negative followups until recently.  We recently noticed 2 adjacent small papillary tumors up on the right lateral wall of his bladder.  He presents now for treatment for these.  TECHNIQUES AND FINDINGS:  The patient was brought to the operating room where he had successful induction of general anesthesia.  He received perioperative Cipro.  Appropriate surgical time-out was performed. Rigid cystoscopy revealed normal anterior urethra with mild trilobar hyperplasia and mild visual obstruction.  The bladder was carefully panendoscoped.  Two small adjacent papillary tumors up on the right lateral wall of his bladder, more anterior then posterior.  This area was cold-cup resected.  The surrounding area was then fulgurated with a Bugbee electrode with excellent hemostasis.  No sign of bladder perforation.  The patient's urine was clear at the completion of the procedure, and he had no obvious complications.  He was brought to recovery room in stable condition.     Valetta Fuller, M.D.     DSG/MEDQ  D:   07/29/2010  T:  07/29/2010  Job:  [id number]  Electronically Signed by Barron Alvine M.D. on 08/09/2010 01:47:05 PM

## 2010-08-19 LAB — CULTURE, RESPIRATORY W GRAM STAIN

## 2010-08-19 LAB — AFB CULTURE WITH SMEAR (NOT AT ARMC)

## 2010-08-19 LAB — FUNGUS CULTURE W SMEAR: Fungal Smear: NONE SEEN

## 2010-10-15 NOTE — Consult Note (Signed)
Joe Rosario, Joe Rosario  DOB:  06/30/1940                                        November 15, 2009  CHART #:  [id number]   REASON FOR CONSULTATION:  Left upper lobe non-small-cell cancer.   HISTORY OF PRESENT ILLNESS:  The patient is a 70 year old gentleman with  a history of tobacco abuse, COPD, and tachycardia.  He presented to the  emergency room at [facility] about a month ago with 2 episodes of  severe chest pain, this was anterior left-sided chest pain.  He said it  felt like an ice pick was stuck in his chest.  Workup included blood  tests and chest x-ray which showed a mass.  A CT scan was done and he  has a central left upper lobe mass.  PET showed hypermetabolic activity.  There was also some hypermetabolism extending along likely into the  mainstem bronchus.  This is unclear if this is peribronchial spread.  He  has a history of heavy tobacco abuse and smoking for about 65 years.  He  saw Dr. Shelle Iron in consultation, was found to have severe COPD.  His  pulmonary function tests have showed FEV-1 of about 1.  He did improve  to 1.33 with bronchodilators.  His diffusion capacity was only 50%.  The  patient is a poor historian in terms of exercise tolerance, so it is  difficult get a handle on his exercise tolerance, but he does complain  of his legs wearing out.  Dr. Shelle Iron did a bronchoscopy which revealed  non-small cell carcinoma.   PAST MEDICAL HISTORY:  Significant for COPD, tachycardia, newly  diagnosed non-small-cell lung cancer, hypertension, arthritis.   CURRENT MEDICATIONS:  1. Benazepril 10 mg b.i.d.  2. Hydrochlorothiazide 25 mg daily.  3. Diltiazem 240 mg daily.  4. Alprazolam 0.5 mg q.i.d. p.r.n.  5. Metoprolol 50 mg b.i.d.  6. Spiriva HandiHaler 18 mcg 2 puffs daily.  7. ProAir 2 puffs q.4-6 h. as needed.  8. He also takes vitamin D, vitamin E, vitamin C, garlic oil, black      cherry, selenium, fish oil, coenzyme  Q10, and multivitamin.  9. He uses hydrocodone 10/650 one q.6 h. p.r.n. pain.   ALLERGIES:  He has known drug allergies.   FAMILY HISTORY:  Significant for heart disease.  His mother died of an  MI.   SOCIAL HISTORY:  He smokes about 1-1/2 packs a day for 65 years.  He  says he has only had one cigarette in the past 6 days.  He is married.  He is retired.  He worked at [employer].   REVIEW OF SYSTEMS:  The patient is anxious, shortness of breath with  exertion, leg tiredness with exertion.  No definitive chest tightness or  chest pressure with exertion, bruises easily, bleeds easily, no  significant weight loss.  No headaches or other visual symptoms.  All  other systems are negative.   PHYSICAL EXAMINATION:  General:  The patient is a 70 year old gentleman  in no acute distress.  He is rather anxious.  HEENT:  He is wearing  glasses, otherwise unremarkable.  Neck:  Without thyromegaly,  adenopathy, or bruits.  Cardiac:  Regular rate and rhythm.  Normal S1  and S2.  Lungs:  Diminished almost inaudible breath sounds bilaterally.  There is  no wheezing.  Abdomen:  Soft, nontender.  Extremities:  Marked  peripheral edema, and there is extensive ecchymosis throughout the upper  extremities.  He has 2+ pitting edema in his lower extremities.   LABORATORY DATA:  FEV-1 is 1.07, 1.33 after bronchodilators that was 51%  of predicted.  His diffusion capacity is 13.7, 53% of predicted, it is  not corrected for hemoglobin.  He had combined restrictive obstructive  disease with bronchodilators.  CT and PET reviewed.  He has a central  left upper lobe mass.  Radiologists were not certain what this extension  of the hypermetabolic activity is, but it could be peribronchial or  submucosal spread as it trach along the left main stem bronchus.   ASSESSMENT AND PLAN:  I do not think the patient is a very good  candidate for surgery given his severe chronic obstructive pulmonary  disease, poor  exercise tolerance, excessive bruising and bleeding.  It  is unclear what is cardiac status is, but that would have to be worked  up if we were going to try and pursue surgery at this time.  I am also  concerned about the appearance of the PET scan, and I really think this  could represent some peribronchial or submucosal spread of the tumor.  I  would favor initial treatment with chemotherapy and/or radiation  therapy.  Given his poor functional status, I doubt that he will come to  surgery, but we  could reassess that midway through his cycle of chemo and/or radiation  before making a final determination in that regard.   Salvatore Decent Dorris Fetch, M.D.  Electronically Signed   SCH/MEDQ  D:  11/15/2009  T:  11/16/2009  Job:  [id number]   cc:   Barbaraann Share, MD,FCCP  Paulina Fusi, MD  Lajuana Matte, MD

## 2010-10-15 NOTE — Assessment & Plan Note (Signed)
OFFICE VISIT   Joe Rosario, Joe Rosario  DOB:  09/11/1940                                       03/08/2008  ZOXWR#:[id number]   The patient returns for followup today.  He had a Gore excluder stent  graft placed on September 10.  He has recovered well from this.  He had  some slight separation of the incision in his right groin but otherwise  has done well.   PHYSICAL EXAMINATION:  On physical exam today blood pressure is 140/89  in the left arm, pulse is 87 and regular.  Abdomen is soft, nontender,  nondistended with no pulsatile mass.  Right groin incision has a slight  area of separation approximately 1 cm but there is dry eschar over this  and the incision appears to be healing well.  There is no evidence of  infection.  Left groin incision is well-healed.  He has 2+ femoral  pulses bilaterally.   He had a CT angiogram today of the abdomen and pelvis.  This showed that  the stent graft is in good position with the top stent just below the  renal arteries.  There is no evidence of type 1 or type 2 endoleak.  There is a good seal on the iliac component as well.  Aneurysm diameter  is currently 5 cm.  Of note, he previously had a CT scan in August which  suggested an abnormality on the right aspect of his bladder.  This was  thought to be due to contrast at that time.  However, this area is  present again on today's CT scan.  Today's interpretation of that is  that this may be consistent with a bladder mass.  The patient denies any  symptoms of hematuria or dysuria.  I counseled him today that he needs  followup with a urologist regarding this.  We have scheduled him for an  appointment with [facility] to further evaluate this.   Janetta Hora. Fields, MD  Electronically Signed   CEF/MEDQ  D:  03/08/2008  T:  03/09/2008  Job:  [id number]   cc:   [facility]  Dr Foye Deer

## 2010-10-15 NOTE — Assessment & Plan Note (Signed)
OFFICE VISIT   Joe Rosario, Joe Rosario  DOB:  1940/12/23                                       01/26/2008  EAVWU#:[id number]   The patient returns for followup today after his CT angiogram.  This  showed an infrarenal abdominal aortic aneurysm 5.5 cm in diameter.  Of  note, he had a retroaortic left renal vein.  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  The aorta has a good neck above the  aneurysm that is at least 3 cm in length.  The neck is approximately 25  mm in diameter.  Left and right common iliac arteries are minimally  tortuous.  There is some calcification at the iliac bifurcations.  The  iliac diameters are 9.2 mm on the left and 8.8 mm on the right.  Due to  the fact that the patient has a retroaortic left renal vein and also due  to his preference, I believe the best option for him would be placement  of a Gore Excluder endovascular stent graft.  I have scheduled him for a  cardiology evaluation at his cardiologist's in [place] to will make  sure that he has no underlying significant coronary artery disease  before his repair.  We also had the [facility] people evaluate him for  measurements and stent graft placement on September 10.  I have started  him on 1 aspirin 81 mg once a day today.   PHYSICAL EXAM:  Blood pressure today of 145/94 in the left arm, heart  rate 106 and regular.  Abdomen:  Soft, nontender, 2+ femoral pulses.   Janetta Hora. Fields, MD  Electronically Signed   CEF/MEDQ  D:  01/26/2008  T:  01/27/2008  Job:  [id number]   cc:   Dr. Foye Deer

## 2010-10-15 NOTE — Assessment & Plan Note (Signed)
OFFICE VISIT   Joe Rosario, Joe Rosario  DOB:  [birth year]                                       05/10/2008  OZHYQ#:[id number]   The patient returns for followup today after placement of an aneurysm  stent graft in September of 2009.  He reports no abdominal or back pain.  Overall he has been doing well.  He did injure his right leg recently  while gathering some firewood.  He states that this is healing.   PHYSICAL EXAM:  Blood pressure is 165/102 in the left arm and 185/107 in  the right arm, pulse is 88 and regular.  Abdomen is soft, nontender,  nondistended with no pulsatile mass.  Extremities, he has 2+ femoral  pulses bilaterally and well-healed groin scars.  He has 1+ dorsalis  pedis and posterior tibial pulses bilaterally.   He has a 3 x 2 cm superficial ulceration over the right tibial area.  This is not erythematous and seems to be healing.   I reviewed his CT angiogram of the abdomen and pelvis dated today.  Aneurysm measures 4.7 cm in diameter today.  Stent graft is in good  position with the top portion adjacent to the origins of the renal  arteries.  There is no evidence of endoleak.  Of note, there was some  thickening along the right aspect of the urinary bladder.  This was  recently treated by Dr. Isabel Caprice.  The patient has followup scheduled with  him.  I informed the patient to let him know of this finding today.  He  will have Dr. Isabel Caprice check this in followup.  As far as his aneurysm is  concerned he will return to see me again in three months' time for  repeat CT angiogram.   Janetta Hora. Fields, MD  Electronically Signed   CEF/MEDQ  D:  05/10/2008  T:  05/11/2008  Job:  [id number]   cc:   Dr Foye Deer  Dr Isabel Caprice

## 2010-10-15 NOTE — Assessment & Plan Note (Signed)
OFFICE VISIT   Joe Rosario, Joe Rosario  DOB:  01/21/1941                                       08/22/2009  ZOXWR#:[id number]   HISTORY OF PRESENT ILLNESS:  The patient is a 70 year old male who  returns for follow-up after prior endovascular stent graft repair.  This  was done in September of 2009.  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  Other than  this, he has been doing well overall.  He states that he recently  discontinued all of his medications, including his blood pressure  medication because he thought they were not working.  I counseled him  that he should discuss with his primary care doctor further and consider  restarting his medications.  He currently is a fairly active and does  several activities out in his yard.  He does not really report any chest  pain but occasionally does have some shortness of breath.   Chronic medical problems include COPD, hypertension.  These are  currently controlled.   FAMILY HISTORY:  Unremarkable.   SOCIAL HISTORY:  He is married, has 4 children.  He is retired.  Currently smokes 1 pack of cigarettes per day.  Smoking cessation was  discussed with the patient again today.   A full 12-point review of systems was performed on the patient today.  Please see intake the referral form for details regarding this.   PHYSICAL EXAMINATION:  Blood pressure is 171/97 in the left arm,  respirations 20, heart rate 118.  HEENT:  Unremarkable.  Neck has 2+  carotid pulses without bruit.  Chest is clear to auscultation.  Cardiac:  Regular rate rhythm without murmur.  Abdomen is soft, nontender,  nondistended.  No pulsatile mass.  Extremities:  He has 2+ femoral  pulses bilaterally.  He has trace edema in both lower extremities.  He  has absent pedal pulses bilaterally.  Neurologic exam shows symmetric  upper extremity and lower extremity and motor strength.  Skin has no  open  ulcers or rashes.   He had a CT angiogram of the abdomen and pelvis today which shows no  endoleak in his aneurysm.  Overall size is stable.  Current diameter is  around 4.7 cm.   Overall, patient appears to be doing well at this point.  He has no  complications from his stent graft.  He will follow up in 1 year's time  for repeat aortic ultrasound.  We did forward the results of his CT scan  to Dr. Isabel Caprice, who has been following him for his bladder problems.  He  will see [place] again in 1 year.     Janetta Hora. Fields, MD  Electronically Signed   CEF/MEDQ  D:  08/23/2009  T:  08/23/2009  Job:  [id number]   cc:   Dr. Val Eagle

## 2010-10-15 NOTE — Discharge Summary (Signed)
Joe Rosario, Joe Rosario                  ACCOUNT NO.:  [id number]   MEDICAL RECORD NO.:  [id number]          PATIENT TYPE:  INP   LOCATION:  3312                         FACILITY:  [facility]   PHYSICIAN:  Janetta Hora. Fields, MD  DATE OF BIRTH:  [birth year]   DATE OF ADMISSION:  02/10/2008  DATE OF DISCHARGE:  02/11/2008                               DISCHARGE SUMMARY   DISCHARGE DIAGNOSES:  1. Abdominal aortic aneurysm.  2. History of smoking.  3. Hypertension.  4. Dyslipidemia.   PROCEDURE PERFORMED:  Endovascular repair of abdominal aortic aneurysm  by Dr. Darrick Penna on February 10, 2008, utilizing a 28 x 14 x 14 main body  and a 16 x 11.5 limb.   COMPLICATIONS:  None.   CONDITION ON DISCHARGE:  Stable, improving.   DISCHARGE MEDICATIONS:  He is instructed to resume all preoperative  medications consisting of:  1. Metoprolol 25 mg p.o. daily.  2. Benazepril 10 mg p.o. daily.  3. Hydrochlorothiazide 25 mg p.o. daily.  4. Alprazolam 0.5 mg p.r.n.  5. Imodium p.r.n.  6. Hydrocodone 10/60 p.o. daily.  7. WelChol 125 mg p.o. daily.  8. Xyzal 5 mg p.o. daily.  9. Vitamin B, C, E.  10.Omega-3 fish oil p.o. daily.  11.Selenium p.o. daily.  12.Lecithin p.o. daily.  13.Garlic p.o. daily.   He is given a prescription for Darvocet-N 100 one p.o. q.4 h. p.r.n.  pain, total #20 were given.   DISPOSITION:  He is being discharged to home in stable condition with  his wounds healing well.  He is instructed to observe the wounds for any  signs of infection.  He is given further information as to his activity  level.  He is to see Dr. Darrick Penna in 4 weeks with abdominal and pelvic CTA  using stent graft protocol and ABIs.  The office will arrange a visit.   BRIEF IDENTIFYING STATEMENT:  For complete details, please refer the  typed history and physical.  Briefly, this very pleasant 70 year old  gentleman was referred to Dr. Darrick Penna for evaluation of an abdominal  aortic aneurysm.  He felt that he was  a suitable candidate for  endovascular repair.  [name] was informed of the risks and benefits of  the procedure and after careful consideration, elected to proceed with  surgery.   HOSPITAL COURSE:  Preoperative workup was completed as an outpatient.  He was brought in through same-day surgery and underwent the  aforementioned endovascular repair of his abdominal aortic aneurysm.  For complete details, please refer the typed operative report.  The  procedure was without complications.  He was returned to the post  anesthesia care unit extubated.  Following stabilization, he was  transferred to a bed on a surgical step-down unit.  He was observed  overnight.  His activity level was advanced as was his diet as  tolerated.  He was desirous of discharge on the first postoperative day.  He had been stable overnight and was felt to be stable and was  discharged to home.      Joe Arms, PA  Janetta Hora. Fields, MD  Electronically Signed    KEL/MEDQ  D:  02/11/2008  T:  02/12/2008  Job:  [id number]

## 2010-10-15 NOTE — Op Note (Signed)
Joe Rosario, Joe Rosario                  ACCOUNT NO.:  [id number]   MEDICAL RECORD NO.:  [id number]          PATIENT TYPE:  AMB   LOCATION:  [facility]                         FACILITY:  [facility]   PHYSICIAN:  Valetta Fuller, M.D.  DATE OF BIRTH:  08-May-1941   DATE OF PROCEDURE:  DATE OF DISCHARGE:                               OPERATIVE REPORT   PREOPERATIVE DIAGNOSIS:  Bladder tumor, right lateral wall.   POSTOPERATIVE DIAGNOSIS:  Bladder tumor, right lateral wall.   PROCEDURE PERFORMED:  Cystoscopy, transurethral resection of bladder  tumor, bladder biopsy with fulguration and instillation of mitomycin.   SURGEON:  Valetta Fuller, M.D.   ANESTHESIA:  General.   INDICATIONS:  Joe Rosario is 70 year old male.  Recently referred by  Joe Hora. Fields, MD because of a questionable abnormality in the  urinary bladder.  The patient had several serial imaging studies to  assess an abdominal aortic aneurysm which showed a questionable  abnormality in the right side of his bladder.  Initially this was  thought to be artifact, but then there was concern that this was a  transitional cell carcinoma.  The patient was sent to [place] for assessment.  He had minimal voiding symptoms and no gross hematuria.  He had no  previous urologic history and clinical exam was unremarkable.  Cystoscopy revealed a tumor.  This involved the right lateral aspect of  the bladder wall, more anterior than posterior.  The tumor appeared to  be 2.5-3 cm in size.  It was primarily exophytic.  I saw no other  evidence of obvious tumor.  The patient underwent extensive consultation  with regard to management strategies.  TURBT/bladder biopsy was  recommended.  We felt as long as there was no evidence of any bladder  perforation or problems that mitomycin instillation would also be  indicated in this setting.  The patient now presents for definitive  evaluation, resection.  Full informed consent has been obtained.   TECHNIQUE AND  FINDINGS:  The patient was brought to the operating room  where he had successful induction of general anesthesia.  He was placed  in the lithotomy position and prepped and draped in the usual manner.  Cystoscopy revealed an unremarkable anterior urethra.  Mild trilobar  hyperplasia with a fairly high riding median bar.  The bladder showed no  gross abnormalities with the exception of the right lateral wall where  there was approximately a 3 cm exophytic papillary-appearing tumor.  With both lens systems I carefully looked at the bladder and saw no  evidence of other obvious tumor.   The cystoscope was removed and the 28-French continuous flow  resectoscope was inserted.  We used this to resect the lateral wall  tumor, attempting to also obtain smooth muscle to be able to check  carefully for any evidence of invasion.  We then removed the  resectoscope and went back to the cold-cup biopsy to take a few deeper  bites near the edge of the tumor.  There was no evidence of bladder  perforation.  A Bugbee electrode was used for electrocautery  and urine  was relatively clear to very light pink.  Again, re-inspection of the  bladder revealed no evidence of bladder wall perforation.  Lidocaine  jelly was instilled per urethra and the bladder was drained.  We then  instilled 30 mL of mitomycin containing 30 mg of active drug.  This will  be left indwelling in his bladder for 45  minutes and the PACU personnel were instructed to unplug his Foley at a  specific time.  He will go home with the Foley catheter in place for  approximately 48-72 hours.  The patient appeared to tolerate the  procedure well and there were no obvious complications.      Valetta Fuller, M.D.  Electronically Signed     DSG/MEDQ  D:  04/03/2008  T:  04/03/2008  Job:  [id number]   cc:   Joe Hora. Fields, MD  [address], [place] [address]
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
## 2010-10-15 NOTE — Op Note (Signed)
Joe Rosario, Joe Rosario                  ACCOUNT NO.:  [id number]   MEDICAL RECORD NO.:  [id number]          PATIENT TYPE:  INP   LOCATION:  3312                         FACILITY:  [facility]   PHYSICIAN:  Janetta Hora. Fields, MD  DATE OF BIRTH:  15-Jul-1940   DATE OF PROCEDURE:  02/10/2008  DATE OF DISCHARGE:                               OPERATIVE REPORT   PROCEDURE:  Gore excluder aneurysm, stent graft.   PREOPERATIVE DIAGNOSIS:  Abdominal aortic aneurysm.   POSTOPERATIVE DIAGNOSIS:  Abdominal aortic aneurysm.   ANESTHESIA:  General.   ASSISTANT:  Quita Skye. Hart Rochester, MD   OPERATIVE FINDINGS:  1. 28 x 14 x 14 main body Gore excluder stent graft.  2. 16 x 11.5 left limb.   OPERATIVE DETAILS:  After obtaining informed consent, the patient was  taken to the operating room.  The patient was placed in supine position  on operating table.  After induction of general anesthesia and  endotracheal intubation, Foley catheter was placed.  Next, the patient  was prepped from the umbilicus down to the knees.  Bilateral oblique  incisions were made in each groin.  The left femoral cutdown and repair  of the vessels is dictated as a separate operative note by Dr. Hart Rochester.  On the right side, incision was carried down through the subcutaneous  tissues down to the level of common femoral artery.  There was some  posterior plaque, but overall, the artery was soft in character on its  anterior surface.  Circumflex iliac branch was dissected free  circumferentially and vessel loop placed around this.  A vessel loop was  also placed around the common femoral artery just above this.  The  common femoral artery was then dissected free down to the level of  femoral bifurcation and a vessel loop placed around it at this location  as well.  Next, a Majestic needle was used to cannulate the right common  femoral artery and a 0.035 J-tipped guidewire threaded into the  abdominal aorta under fluoroscopic guidance.  A  short 8-French sheath  was then placed on the right common femoral artery.  A 5-French Omni  flush catheter was then placed over the guidewire in the abdominal  aorta.  Next, a Majestic needle was used to cannulate the left common  femoral artery and a 0.035 J-tipped guidewire threaded in the abdominal  aorta under fluoroscopic guidance.  Contrast arteriogram was then  obtained of the abdominal aorta to determine the location of the renal  arteries and also using the calibrated Omni flush catheter to determine  the length of the main body graft.  After this had been performed, a 28  x 14 x 14 main body right side approach was decided.  The Omni flush  catheter was removed over a 0.035 Amplatz Super Stiff wire.  The Omni  flush catheter was then threaded over the J-wire in the left groin just  above the level of the renal arteries.  The main body device was then  placed through the right common femoral artery and up to the level of  the right common iliac system.  However, there was some resistance to  the right common iliac artery initially.  Therefore, the device was  pulled back and an attempt was made with the 18-French dilator through  this area and it also would not fit.  Therefore, this was pulled back  and attempt was made to pass the 16-French sheath up to the right common  iliac artery.  This also would not pass easily.  Therefore, this was all  taken back and an 8 x 4 mm angioplasty balloon was brought up in the  operative field and used angioplasty of the right common iliac artery.  This was inflated to profile for 1 minute.  This was then deflated.  The  18-French sheath was then brought back upon the operative field and  placed over the guidewire.  This still had a small amount of friction,  but I was able to pass this through the right common iliac system at  that time.  The sheath was then advanced up to the level adjacent to the  renal arteries.  Next, a contrast angiogram  was obtained through the  Omni flush catheter to determine precise location of the renal arteries  with magnification.  The main body stent graft was then deployed in the  usual fashion under fluoroscopic guidance watching the location of the  renal arteries.  This was in good position at this portion of the case.  Next, the Omni flush catheter was pulled back over a J-wire down below  the level of the main body.  The main body had been deployed with the  contralateral gate at approximately 9 o'clock position.  The J-wire was  then used to selectively catheterize the gate and this was then advanced  up into the main portion of the graft.  A 5-French Omni flush catheter  was then threaded over the J-wire and placed in the main body section  and allowed to twirl to make sure it was within graft.  A 0.035 Amplatz  wire was then threaded through the Omni flush catheter up into the  descending thoracic aorta.  Using the Omni flush catheter with markers,  a retrograde contrast angiogram was obtained through the sheath on the  left side to determine the level of the left internal iliac artery.  Based on this, a 16 x 11.5 limb was brought up in the operative field  and the Omni flush catheter was removed and the 8-French sheath on the  left side removed and a 16-French sheath placed through the left common  femoral artery and then the left limb device placed through this.  The  left limb was then deployed with its proximal segment right at the long  marker on the contralateral gate.  It was then deployed in the usual  fashion down to the level of the takeoff of the left internal iliac  artery.  The delivery system was then removed and a molding balloon was  brought up on the operative field.  This was then advanced over the  Amplatz wire in the left common femoral artery and the proximal segment  of the graft was tacked at the level of the renals.  All joint segments  in the iliacs on both sides  were also tacked.  Next, the 5-French Omni  flush catheter was brought up in the operative field and advanced over  the Amplatz wire into the main portion of the graft.  A completion  arteriogram was  then obtained.  This showed that the left renal arteries  were widely patent.  Left and right internal iliac arteries were patent.  There was good flow through the graft.  There was no proximal or distal  type 1 endoleak.  There was a possible small type 2 lumbar endoleak  around the portion of the graft, but the aneurysm sac did not fill.  At  this point, all wires and catheters were removed.  The right common  femoral artery was controlled proximally with Henley clamp and distally  with a vessel loop.  The patient had been given 7000 units of heparin  prior to placing any wires, catheters, or sheaths and this was not  reversed at the end of the case.  Left common femoral artery was also  closed and this is dictated separately by Dr. Hart Rochester.  The right common  femoral artery was closed with a running 5-0 Prolene suture.  Just prior  to completion anastomosis was fore bled, back bled, and thoroughly  flushed.  Anastomosis was secured.  Clamps were released.  There was a  good pulsatile flow in the right common femoral artery immediately.  The  groin was then closed in several layers of running 2-0, 3-0, and 4-0  Vicryl subcuticular stitch in the skin.  Benzoin and Steri-Strips were  applied.  The patient tolerated the procedure well and there were no  complications.  Instruments, sponge, and needle counts were correct at  the end of the case.  The patient was taken to the recovery room in  stable condition.      Janetta Hora. Fields, MD  Electronically Signed     CEF/MEDQ  D:  02/10/2008  T:  02/11/2008  Job:  [id number]

## 2010-10-15 NOTE — Op Note (Signed)
Joe Rosario, Joe Rosario                  ACCOUNT NO.:  [id number]   MEDICAL RECORD NO.:  [id number]          PATIENT TYPE:  INP   LOCATION:  3312                         FACILITY:  [facility]   PHYSICIAN:  Quita Skye. Hart Rochester, M.D.  DATE OF BIRTH:  01-29-1941   DATE OF PROCEDURE:  DATE OF DISCHARGE:                               OPERATIVE REPORT   Dr. Darrick Penna has dictated the operative report for the aortic stent graft  portion.  I am dictating for left femoral artery exposure for insertion  of aortic stent graft.   PREOPERATIVE DIAGNOSIS:  Infrarenal abdominal aortic aneurysm.   POSTOPERATIVE DIAGNOSIS:  Infrarenal abdominal aortic aneurysm.   Operation for Dr. Hart Rochester is exposure of left femoral artery for  insertion of aortobi-iliac stent graft.   SURGEON:  Quita Skye. Hart Rochester, MD   FIRST ASSISTANT:  Janetta Hora. Fields, MD for this portion.   PROCEDURE:  The patient was taken to the operating room and placed in  supine position.  Satisfactory general endotracheal anesthesia was  administered.  The abdomen and both inguinal areas were prepped with  Betadine scrub and solution, draped in routine sterile manner.  Dr.  Darrick Penna exposed the right side.  Dr. Hart Rochester exposed left side through  suprainguinal incisions.  This was carried down through subcutaneous  tissue.  Superficial profunda femoris arteries were dissected free and  encircled with vessel loops.  There was some calcific plaque formation  in the left common femoral artery beginning proximal to the origin of  the profunda, although the superficial femoral artery was quite soft and  the distal common femoral artery was soft laterally.  Following  completion of the stent graft procedure, the arteriotomy in the left  common femoral artery was repaired using continuous 6-0 Prolene suture.  Clamps released and there was an excellent pulse in all vessels.  Following adequate hemostasis, wound was closed in layers with Vicryl in  subcuticular  fashion.  Sterile dressing applied and the patient taken to  recovery room in satisfactory condition.      Quita Skye Hart Rochester, M.D.  Electronically Signed     JDL/MEDQ  D:  02/10/2008  T:  02/11/2008  Job:  [id number]
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
## 2010-10-18 NOTE — Op Note (Signed)
[facility]  Patient:    Joe Rosario, Joe Rosario                         MRN: [id number] Proc. Date: 06/24/00 Adm. Date:  [id number] Disc. Date: [id number] Attending:  Shelba Flake                           Operative Report  PREOPERATIVE DIAGNOSIS:  Herniated nucleus pulposus L4-5 right.  POSTOPERATIVE DIAGNOSES: 1. Herniated nucleus pulposus L4-5 right. 2. Lateral recess stenosis of L4-5 and L3-4. 3. Foraminal stenosis. 4. Degenerative spurring facet at 4-5.  OPERATION: 1. Hemilaminectomy L4 right. 2. Foraminotomy L4 right. 3. Microdiskectomy L4-5 right. 4. Lateral recess decompression at L4-5.  SURGEON:  Javier Docker, M.D.  ANESTHESIA:  General.  BRIEF HISTORY AND INDICATIONS:  A 70 year old with refractory radicular pain at L4 nerve root distribution secondary to foraminal HNP at L4-5.  This is a atypical distribution within the groin; however it was confirmed with selective nerve root block with temporary relief.  The patient had persistent symptoms and with an MRI indicating foraminal extraforaminal disk herniation, and it was felt that decompression of the foramen and microdiskectomy to decompress the L4 nerve root would help his symptoms.  The risks and benefits of the procedure were discussed including bleeding, infection, damage to neurovascular structures, no relief of symptoms, recurrent disk herniation, need for fusion in the future, etc.  TECHNIQUE:  The patient was placed in the supine position after induction of adequate general anesthesia with 1 g Kefzol.  The patient was placed on the [place] frame.  All bony prominences were well padded.  The lumbar region was prepped and draped in the usual sterile fashion.  Two 18-gauge spinal needles were utilized to localize the L4-5 interspace and confirmed with x-ray. Decision was then made from spinous process L4 to spinous process of L5. Subcutaneous tissue was dissected.  Electrocautery  was utilized to achieve hemostasis.  Marcaine 0.25% with epinephrine was infiltrated in subcutaneous tissue.   First, the lumbar fascia was identified and divided in line with the skin incision.  Paraspinous muscles were gently elevated from the lamina of L4 and L5 and into the 3-4 space.  There was a probability of cephalad exposure due to the migration of the disk herniation.  Confirmatory radiographs confirmed the space.  Inspection of the 4-5 and 3-4 space indicated significant shingling at 3-4 secondary to facet hypertrophy and overlay of the lamina.  It was felt, due to the intralaminar distance both cephalocaudad and mediolateral, that exposure to this would require a hemilaminectomy.  A 2 mm and subsequently 3 mm Kerrison was therefore utilized to perform a hemilaminectomy of L4 to identify the L4 nerve root which was displaced cephalad.  Beneath the lamina, the 4 root was found to be compressed against the pedicle.  This was severe in the lateral recess and up into the foramen. The nerve root was identified and protected at all times, and a hemilaminectomy was performed to fully decompress the L4 nerve root.  This was identified and found to be again compressing at the cephalad edge of pedicle 5 and confirmatory radiograph obtained with a Penfield placed in the foramen. The severe lateral recess stenosis was noted at 4-5 as well, and high-speed bur was utilized to perform a partial medial hemifacetectomy.  Bipolar electrocautery was utilized to achieve hemostasis.  Ligamentum flavum was then folded into  the foramen, and this was removed with a 2 mm Kerrison.  Next, with the nerve root well protected, an annulotomy was performed in the lateral aspect of the disk, and copious disk material was removed from the subannular space.  It was also felt there was degenerative spurring on the end of the vertebral body of 4 with reflection of the annulus, contributing to the soft tissue mass  within the foramen.  There was a spur noted as well.  Cutting osteophyte rongeur was utilized to removed a portion of the spur.  With this, diskectomy, ligamentum flavum removal, and foraminotomy, this produced a satisfactory decompression of the L4 nerve root.  It was freely mobile without residual compression.  Decompression was carried out to the lateral aspect of the pedicle.  Just beyond that where the mass was noted on the MRI, there was a ______ this, it was just a large degenerative ridge of the vertebral body.  It was felt at this point in time prudent not to perform a full facetectomy necessitating fusion as it was felt that the intraoperative decompression was satisfactory to relieve the pressure on the L4 nerve root.  It was found to be erythematous and edematous.  The wound was copiously irrigated as was the disk space. Bipolar electrocautery was utilized to achieve hemostasis.   Hockey stick probe placed in the foramen at 4 and found to be widely patent as was the foramen of 3 from the 3-4 interspace.  Ligamentum flavum was also removed from the 3-4 interspace to fully decompress the L4 root due to the significant amount of shingling and overlap that occurred.  This was again to fully decompress the L4 nerve root.  Again, the wound was copiously irrigated.  Bone wax was placed over the laminectomy sites.  Thrombin-soaked Gelfoam was placed in the laminotomy defect after inspection revealed no CSF leakage or active bleeding.  McCullough retractor was removed.  Paraspinous muscles were inspected with no evidence of active bleeding.   The operating microscope which had been draped and brought into the surgical field for the microdiskectomy was then removed.   Dorsal lumbar fascia was reapproximated with #1 Vicryl in figure-of-eight sutures.  Subcutaneous tissue was reapproximated with 2-0 Vicryl sutures.  Skin was reapproximated with staples.  The wound was dressed sterilely.    The patient was placed supine on the hospital bed, extubated without difficulty, and transported to the recovery room in satisfactory condition.   The patient tolerated the procedure well without complication. DD:  06/24/00 TD:  06/24/00 Job: [id number]

## 2011-03-04 LAB — POCT I-STAT 4, (NA,K, GLUC, HGB,HCT)
Glucose, Bld: 107 — ABNORMAL HIGH
HCT: 48
Hemoglobin: 16.3
Potassium: 3.8
Sodium: 139

## 2011-03-05 LAB — APTT: aPTT: 26

## 2011-03-05 LAB — BLOOD GAS, ARTERIAL
Acid-Base Excess: 6.1 — ABNORMAL HIGH
Drawn by: 181601
Patient temperature: 98.6
TCO2: 31.8

## 2011-03-05 LAB — CBC
HCT: 35.9 — ABNORMAL LOW
Hemoglobin: 12.4 — ABNORMAL LOW
MCHC: 34
MCV: 104.6 — ABNORMAL HIGH
Platelets: 195
RBC: 3.5 — ABNORMAL LOW
RDW: 13.7
WBC: 10.5

## 2011-03-05 LAB — BASIC METABOLIC PANEL
GFR calc Af Amer: >60
GFR calc non Af Amer: >60
Glucose, Bld: 127 — ABNORMAL HIGH
Potassium: 3.6
Sodium: 134 — ABNORMAL LOW

## 2011-03-05 LAB — COMPREHENSIVE METABOLIC PANEL
AST: 21
Albumin: 3.9
CO2: 27
Calcium: 9.4
Creatinine, Ser: 0.79
GFR calc Af Amer: >60
GFR calc non Af Amer: >60

## 2011-03-05 LAB — TYPE AND SCREEN

## 2011-03-05 LAB — URINALYSIS, ROUTINE W REFLEX MICROSCOPIC
Ketones, ur: 15 — AB
Nitrite: NEGATIVE
pH: 5.5

## 2011-03-05 LAB — URINE MICROSCOPIC-ADD ON

## 2011-03-05 LAB — ABO/RH: ABO/RH(D): A NEG

## 2011-03-27 ENCOUNTER — Encounter: Payer: Self-pay | Admitting: Internal Medicine

## 2011-03-28 ENCOUNTER — Encounter: Payer: Self-pay | Admitting: Internal Medicine

## 2011-03-28 ENCOUNTER — Ambulatory Visit (INDEPENDENT_AMBULATORY_CARE_PROVIDER_SITE_OTHER): Payer: Medicare Other | Admitting: Internal Medicine

## 2011-03-28 VITALS — BP 140/80 | HR 97 | Ht 64.0 in | Wt 195.2 lb

## 2011-03-28 DIAGNOSIS — C3410 Malignant neoplasm of upper lobe, unspecified bronchus or lung: Secondary | ICD-10-CM

## 2011-03-28 DIAGNOSIS — Z77090 Contact with and (suspected) exposure to asbestos: Secondary | ICD-10-CM

## 2011-03-28 DIAGNOSIS — J438 Other emphysema: Secondary | ICD-10-CM

## 2011-03-28 NOTE — Progress Notes (Signed)
03/28/11- 70 year old male former smoker, referred for evaluation of shortness of breath times one year by Dr. Tomasa Blase, his PCP. He had a long smoking history, ending about 18 months ago. He worked with Dr. Shelle Iron here in 2011 who did bronchoscopy in June of 2011> diagnosis left upper lobe bronchogenic cancer subsequently treated with chemotherapy and radiation therapy. He continues in remission, followed at the [facility]. Since that treatment, he feels short of breath with exertion and asks why?. Treatment was completed in 2011. He denies cough. No dyspnea at rest. Worse in humid weather. Followed by a cardiologist in [place] for paroxysmal tachycardia associated with hypertension. He has nitroglycerin but denies ischemic heart disease. Occasional bilateral anterior short stabbing chest wall type pain. He knows he has emphysema. Also treated for bladder cancer, psoriasis, degenerative disk disease. 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  ROS-see HPI Constitutional:   No-   weight loss, night sweats, fevers, chills, fatigue, lassitude. HEENT:   No-  headaches, difficulty swallowing, tooth/dental problems, sore throat,       No-  sneezing, itching, ear ache, nasal congestion, post nasal drip,  CV:  No- anginal or pleuritic  chest pain, orthopnea, PND, swelling in lower extremities, anasarca, dizziness, palpitations Resp: +  shortness of breath with exertion , not at rest.              No-   productive cough,  No non-productive cough,  No- coughing up of blood.              No-   change in color of mucus.  No- wheezing.   Skin: No-   rash or lesions. GI:  No-   heartburn, indigestion, abdominal pain, nausea, vomiting, diarrhea,                 change in bowel habits, loss of appetite GU: No-   dysuria, change in color of urine, no urgency or frequency.  No- flank pain. MS:  No-    joint pain or swelling.  No- decreased range of motion.  No- back pain. Neuro-     nothing unusual Psych:  No- change in mood or affect. No depression or anxiety.  No memory loss.  OBJ General- Alert, Oriented, Affect-appropriate, Distress- none acute. Overweight, talkative Skin- rash-none, lesions- none, excoriation- none. Sun damage/ psoriasis Lymphadenopathy- none Head- atraumatic            Eyes- Gross vision intact, PERRLA, conjunctivae clear secretions            Ears- Hearing, canals-normal            Nose- Clear, no-Septal dev, mucus, polyps, erosion, perforation             Throat- Mallampati II , mucosa clear , drainage- none, tonsils- atrophic. Dentures Neck- flexible , trachea midline, no stridor , thyroid nl, carotid no bruit Chest - symmetrical excursion , unlabored           Heart/CV- RRR , no murmur , no gallop  , no rub, nl s1 s2                           - JVD- none , edema- none, stasis changes- none, varices- none           Lung-diminished, coarse breath sounds, unlabored, wheeze- none,  cough- none , dullness-none, rub- none. I do not hear fine crackles.           Chest wall-  Abd- tender-no, distended-no, bowel sounds-present, HSM- no Br/ Gen/ Rectal- Not done, not indicated Extrem- cyanosis- none, clubbing, none, atrophy- none, strength- nl. There is no clubbing. Neuro- grossly intact to observation

## 2011-03-28 NOTE — Patient Instructions (Signed)
We will request results of your recent blood tests from [facility] in [place], and your latest CT chest from [facility]  Order- schedule PFT and 6 minute walk test  Sample Spiriva- one daily   Watch to see if it helps with shortness of breath.

## 2011-03-30 NOTE — Assessment & Plan Note (Addendum)
Complained of exertional dyspnea now is consistent with COPD plus the effects of his cancer therapy. A cardiac component is not ruled out and neither is anemia although he has not noted blood loss. He had blood work drawn at his cardiology office and we will try to obtain that.  Question if CT scan shows changes reflecting asbestos exposure. Plan PFT with 6 minute walk test. He declines flu vaccine.

## 2011-04-14 ENCOUNTER — Telehealth: Payer: Self-pay | Admitting: Internal Medicine

## 2011-04-14 MED ORDER — TIOTROPIUM BROMIDE MONOHYDRATE 18 MCG IN CAPS: 18.0000 ug | ORAL_CAPSULE | Freq: Every day | RESPIRATORY_TRACT | Status: DC

## 2011-04-14 NOTE — Telephone Encounter (Signed)
Per CY-spiriva #30 1 qd with prn refills.

## 2011-04-14 NOTE — Telephone Encounter (Signed)
Rx for spiriva was sent and I spoke with pt and notified this was done. Pt verbalized understanding.

## 2011-04-14 NOTE — Telephone Encounter (Signed)
[email or web address]  

## 2011-04-14 NOTE — Telephone Encounter (Signed)
I spoke with pt wife and she states the spiriva has helped with his breathing. Pt wife states pt ran out Saturday and could tell a difference in his breathing since he did not take it. Pt is requesting an rx be called into [facility] in [place]. Pt wife states when we call back okay to leave detailed message on VM. Please advise Dr. Maple Hudson if okay to send rx for spiriva. Thanks  Carver Fila, CMA

## 2011-04-14 NOTE — Telephone Encounter (Signed)
Ms. Wey returned triage's call & can be reached at [phone].  Antionette Fairy

## 2011-04-15 ENCOUNTER — Ambulatory Visit (INDEPENDENT_AMBULATORY_CARE_PROVIDER_SITE_OTHER): Payer: Medicare Other | Admitting: Internal Medicine

## 2011-04-15 DIAGNOSIS — J438 Other emphysema: Secondary | ICD-10-CM

## 2011-04-15 DIAGNOSIS — J449 Chronic obstructive pulmonary disease, unspecified: Secondary | ICD-10-CM

## 2011-04-15 LAB — PULMONARY FUNCTION TEST

## 2011-04-15 NOTE — Progress Notes (Signed)
PFT done today. 

## 2011-04-23 ENCOUNTER — Encounter: Payer: Self-pay | Admitting: Internal Medicine

## 2011-05-01 ENCOUNTER — Ambulatory Visit (INDEPENDENT_AMBULATORY_CARE_PROVIDER_SITE_OTHER): Payer: Medicare Other | Admitting: Internal Medicine

## 2011-05-01 ENCOUNTER — Encounter: Payer: Self-pay | Admitting: Internal Medicine

## 2011-05-01 VITALS — BP 128/82 | HR 119 | Ht 64.0 in | Wt 204.0 lb

## 2011-05-01 DIAGNOSIS — J438 Other emphysema: Secondary | ICD-10-CM

## 2011-05-01 NOTE — Progress Notes (Signed)
03/28/11- 70 year old male former smoker, referred for evaluation of shortness of breath times one year by Dr. Tomasa Blase, his PCP. He had a long smoking history, ending about 18 months ago. He worked with Dr. Shelle Iron here in 2011 who did bronchoscopy in June of 2011> diagnosis left upper lobe bronchogenic cancer subsequently treated with chemotherapy and radiation therapy. He continues in remission, followed at the [facility]. Since that treatment, he feels short of breath with exertion and asks why?. Treatment was completed in 2011. He denies cough. No dyspnea at rest. Worse in humid weather. Followed by a cardiologist in [place] for paroxysmal tachycardia associated with hypertension. He has nitroglycerin but denies ischemic heart disease. Occasional bilateral anterior short stabbing chest wall type pain. He knows he has emphysema. Also treated for bladder cancer, psoriasis, degenerative disk disease. 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  05/01/11-  70 year old male former smoker, COPD, hx lung Ca/ XRT/chemoRx, bladder ca, Restless Legs, DGD, PAT, HBP ................Marland Kitchenwife here He declines flu vaccine. He was evaluated by his cardiologist for palpitation and treated with digoxin which he is not taking. He was considered to have noncardiac pain. He reports being in total remission from his lung and bladder cancers. Dry cough for the past 2 weeks. Cough syrup helps. He has not felt that he had a cold. 6 Minute Walk Test- 98%, 92%, 97% on room air. He stopped after 3-1/2 minutes because of shortness of breath and pains in legs calves and hips. Heart rate at that point was 132 with oxygen saturation 91%. EP 170/98. He rested 1 minute and 15 seconds been completed the 6 minutes of total walking. PFT-04/15/11- FEV1 1.23/51%, FEV1/FVC 0.46%, FEF 25-75% was 19% of predicted. Flow volume loop  consistent with emphysema. Severe COPD with some response to bronchodilator.  ROS-see HPI Constitutional:   No-   weight loss, night sweats, fevers, chills, fatigue, lassitude. HEENT:   No-  headaches, difficulty swallowing, tooth/dental problems, sore throat,       No-  sneezing, itching, ear ache, nasal congestion, post nasal drip,  CV:  No- anginal or pleuritic  chest pain, orthopnea, PND, swelling in lower extremities, anasarca, dizziness, palpitations Resp: +  shortness of breath with exertion , not at rest.              No-   productive cough,  No non-productive cough,  No- coughing up of blood.              No-   change in color of mucus.  No- wheezing.   Skin: No-   rash or lesions. GI:  No-   heartburn, indigestion, abdominal pain, nausea, vomiting, diarrhea,                 change in bowel habits, loss of appetite GU: No-   dysuria, change in color of urine, no urgency or frequency.  No- flank pain. MS:  No-   joint pain or swelling.  No- decreased range of motion.  No- back pain. Neuro-     nothing unusual Psych:  No- change in mood or affect. No depression or anxiety.  No memory loss.  OBJ General- Alert, Oriented, Affect-appropriate, Distress- none acute. Overweight, talkative Skin- rash-discolored, lesions- none, excoriation- none. Sun damage/ psoriasis Lymphadenopathy- none Head- atraumatic            Eyes- Gross vision intact,  PERRLA, conjunctivae clear secretions            Ears- Hearing, canals-normal            Nose- Clear, no-Septal dev, mucus, polyps, erosion, perforation             Throat- Mallampati II , mucosa clear , drainage- none, tonsils- atrophic. Dentures Neck- flexible , trachea midline, no stridor , thyroid nl, carotid no bruit Chest - symmetrical excursion , unlabored           Heart/CV- RRR , no murmur , no gallop  , no rub, nl s1 s2                           - JVD- none , edema- none, stasis changes- none, varices- none           Lung-diminished,  coarse breath sounds, unlabored, wheeze- none, cough- none , dullness-none, rub- none. I do not hear fine crackles.           Chest wall-  Abd- tender-no, distended-no, bowel sounds-present, HSM- no. Significant abdominal obesity. Br/ Gen/ Rectal- Not done, not indicated Extrem- cyanosis- none, clubbing, none, atrophy- none, strength- nl. There is no clubbing. Neuro- grossly intact to observation

## 2011-05-01 NOTE — Patient Instructions (Signed)
Continue present meds  Refer to Pulmonary rehabilitation program at [facility].  Dx COPD  I recommend regular walking and weight loss to help your breathing.

## 2011-05-02 NOTE — Assessment & Plan Note (Signed)
Severe COPD with a small reversible component. Bronchodilators are appropriate but have limited impact. If he could walk more for endurance, and lose some of his abdominal weight, that would make a significant difference in his quality of life. I have recommended that he look into pulmonary rehabilitation at [facility] Dr Veatrice Kells can arrange that referral.  Consider a home nebulizer. Since these are issues that can be managed through his primary care office, and there is some difficulty getting up here to [facility], I have offered to see him again as needed.

## 2011-07-15 ENCOUNTER — Ambulatory Visit (INDEPENDENT_AMBULATORY_CARE_PROVIDER_SITE_OTHER): Payer: Medicare Other | Admitting: Internal Medicine

## 2011-07-15 ENCOUNTER — Telehealth: Payer: Self-pay | Admitting: Internal Medicine

## 2011-07-15 ENCOUNTER — Encounter: Payer: Self-pay | Admitting: Internal Medicine

## 2011-07-15 DIAGNOSIS — J4489 Other specified chronic obstructive pulmonary disease: Secondary | ICD-10-CM

## 2011-07-15 DIAGNOSIS — R222 Localized swelling, mass and lump, trunk: Secondary | ICD-10-CM

## 2011-07-15 DIAGNOSIS — C3410 Malignant neoplasm of upper lobe, unspecified bronchus or lung: Secondary | ICD-10-CM

## 2011-07-15 DIAGNOSIS — J449 Chronic obstructive pulmonary disease, unspecified: Secondary | ICD-10-CM

## 2011-07-15 MED ORDER — ARFORMOTEROL TARTRATE 15 MCG/2ML IN NEBU
15.0000 ug | INHALATION_SOLUTION | Freq: Once | RESPIRATORY_TRACT | Status: AC
  Administered 2011-07-15: 15 ug via RESPIRATORY_TRACT

## 2011-07-15 NOTE — Patient Instructions (Signed)
Joe Rosario

## 2011-07-15 NOTE — Telephone Encounter (Signed)
Spoke with pt. He states that his breathing is getting worse, even with o2 gets OOB with walking very short distances. OV with CDY at 3:15 this afternoon. Advised ED sooner if needed.

## 2011-07-15 NOTE — Progress Notes (Signed)
03/28/11- 71 year old male former smoker, referred for evaluation of shortness of breath times one year by Dr. Tomasa Blase, his PCP. He had a long smoking history, ending about 18 months ago. He worked with Dr. Shelle Iron here in 2011 who did bronchoscopy in June of 2011> diagnosis left upper lobe bronchogenic cancer subsequently treated with chemotherapy and radiation therapy. He continues in remission, followed at the [facility] Since that treatment, he feels short of breath with exertion and asks why?. Treatment was completed in 2011. He denies cough. No dyspnea at rest. Worse in humid weather. Followed by a cardiologist in [place] for paroxysmal tachycardia associated with hypertension. He has nitroglycerin but denies ischemic heart disease. Occasional bilateral anterior short stabbing chest wall type pain. He knows he has emphysema. Also treated for bladder cancer, psoriasis, degenerative disk disease. 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  05/01/11-  71 year old male former smoker, COPD, hx lung Ca/ XRT/chemoRx, bladder ca, Restless Legs, DGD, PAT, HBP ................Marland Kitchenwife here He declines flu vaccine. He was evaluated by his cardiologist for palpitation and treated with digoxin which he is not taking. He was considered to have noncardiac pain. He reports being in total remission from his lung and bladder cancers. Dry cough for the past 2 weeks. Cough syrup helps. He has not felt that he had a cold. 6 Minute Walk Test- 98%, 92%, 97% on room air. He stopped after 3-1/2 minutes because of shortness of breath and pains in legs calves and hips. Heart rate at that point was 132 with oxygen saturation 91%. BP 170/98. He rested 1 minute and 15 seconds been completed the 6 minutes of total walking. PFT-04/15/11- FEV1 1.23/51%, FEV1/FVC 0.46%, FEF 25-75% was 19% of predicted. Flow volume loop  consistent with emphysema. Severe COPD with some response to bronchodilator.  07/15/11- 71 year old male former smoker, COPD, hx lung Ca/ XRT/chemoRx, bladder ca, Restless Legs, DGD, PAT, HBP ................Marland Kitchenwife here Worse shortness of breath in the past month with no acute episode. He wants a particular kind of nasal oxygen cannula which fits best. No cough, pain, blood, purulent discharge. Going to pulmonary rehabilitation at [facility]. Continuous oxygen 2 L. CT chest 12/2010/ [facility]- No acute lung process and no mass seen.  ' ROS-see HPI Constitutional:   No-   weight loss, night sweats, fevers, chills, fatigue, lassitude. HEENT:   No-  headaches, difficulty swallowing, tooth/dental problems, sore throat,       No-  sneezing, itching, ear ache, nasal congestion, post nasal drip,  CV:  No- anginal or pleuritic  chest pain, orthopnea, PND, swelling in lower extremities, anasarca, dizziness, palpitations Resp: +  shortness of breath with exertion , not at rest.              No-   productive cough,  No non-productive cough,  No- coughing up of blood.              No-   change in color of mucus.  No- wheezing.   Skin: No-   rash or lesions. GI:  No-   heartburn, indigestion, abdominal pain, nausea, vomiting, diarrhea,                 change in bowel habits, loss of appetite GU: No-   dysuria, change in color of urine, no urgency or frequency.  No- flank pain. MS:  No-   joint pain  or swelling.  No- decreased range of motion.  No- back pain. Neuro-     nothing unusual Psych:  No- change in mood or affect. No depression or anxiety.  No memory loss.  OBJ General- Alert, Oriented, Affect-appropriate, Distress- none acute. Overweight, talkative in short sentences, on O2 demand 2L Skin- rash-discolored, lesions- none, excoriation- none. Sun damage/ psoriasis Lymphadenopathy- none Head- atraumatic            Eyes- Gross vision intact, PERRLA, conjunctivae clear secretions             Ears- Hearing, canals-normal            Nose- Clear, no-Septal dev, mucus, polyps, erosion, perforation             Throat- Mallampati II , mucosa clear , drainage- none, tonsils- atrophic. Dentures Neck- flexible , trachea midline, no stridor , thyroid nl, carotid no bruit Chest - symmetrical excursion , unlabored           Heart/CV- RRR , no murmur , no gallop  , no rub, nl s1 s2                           - JVD- none , edema- none, stasis changes- none, varices- none           Lung-diminished, coarse breath sounds, unlabored, wheeze- none, cough- none , dullness-none, rub- none.           Chest wall-  Abd- Br/ Gen/ Rectal- Not done, not indicated Extrem- cyanosis- none, clubbing, none, atrophy- none, strength- nl. There is no clubbing. Neuro- grossly intact to observation

## 2011-07-18 NOTE — Assessment & Plan Note (Signed)
CT chest of July 2012 did not show an active process.

## 2011-07-18 NOTE — Assessment & Plan Note (Signed)
He is encouraged to continue pulmonary rehabilitation. Plan-try a longer half-life nebulizer therapy with Rosalyn Gess

## 2011-07-18 NOTE — Assessment & Plan Note (Signed)
CT scan of 2012 could not confirm presence of mass or swelling. This issue can be watched long-term.

## 2011-07-30 ENCOUNTER — Encounter: Payer: Self-pay | Admitting: Vascular Surgery

## 2011-07-31 ENCOUNTER — Encounter (INDEPENDENT_AMBULATORY_CARE_PROVIDER_SITE_OTHER): Payer: Medicare Other | Admitting: *Deleted

## 2011-07-31 ENCOUNTER — Ambulatory Visit (INDEPENDENT_AMBULATORY_CARE_PROVIDER_SITE_OTHER): Payer: Medicare Other | Admitting: Vascular Surgery

## 2011-07-31 ENCOUNTER — Encounter: Payer: Self-pay | Admitting: Vascular Surgery

## 2011-07-31 VITALS — BP 153/84 | HR 95 | Resp 16 | Ht 64.0 in | Wt 200.0 lb

## 2011-07-31 DIAGNOSIS — I714 Abdominal aortic aneurysm, without rupture, unspecified: Secondary | ICD-10-CM

## 2011-07-31 DIAGNOSIS — Z48812 Encounter for surgical aftercare following surgery on the circulatory system: Secondary | ICD-10-CM

## 2011-07-31 NOTE — Progress Notes (Signed)
[facility] [place] HISTORY AND PHYSICAL    History of Present Illness:  Patient is a 71 y.o.71 y.o. year old male who presents for follow-up evaluation of AAA. He underwent Gore Excluder aneurysm stent graft repair in 2009. The patient denies new abdominal or back pain.  The patient's atherosclerotic risk factors remain hypertension.  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  These are all currently stable and followed by his primary care physician.   Past Medical History  Diagnosis Date  . DDD (degenerative disc disease), lumbosacral   . Psoriasis   . Tachycardia   . Irritable colon   . Unspecified essential hypertension   . Chronic sinusitis   . Lumbosacral spondylosis without myelopathy   . Anxiety   . Cervical disc displacement   . COPD (chronic obstructive pulmonary disease)   . Malignant neoplasm of bronchus and lung, unspecified site   . Bladder cancer   . Squamous cell carcinoma of scalp      Past Surgical History  Procedure Date  . Rotator cuff repair     right  . Appendectomy   . Shoulder surgery     left  . Tonsillectomy   . Hip fracture surgery     right  . Abdominal aortic aneurysm repair        Review of Systems:  Neurologic: denies symptoms of TIA, amaurosis, or stroke Pulmonary: See above Abdomen: denies abdominal pain nausea or vomiting  History   Social History  . Marital Status: Married    Spouse Name: N/A    Number of Children: 6  . Years of Education: N/A   Occupational History  . Not on file.   Social History Main Topics  . Smoking status: Former Smoker -- 1.0 packs/day for 30 years    Types: Cigarettes    Quit date: 03/27/2010  . Smokeless tobacco: Not on file  . Alcohol Use: Yes  . Drug Use: No  . Sexually Active: Not on file   Other Topics Concern  . Not on file   Social History Narrative  . No narrative on file    No Known Allergies  Current Outpatient Prescriptions on File Prior to  Visit  Medication Sig Dispense Refill  . albuterol (PROAIR HFA) 108 (90 BASE) MCG/ACT inhaler Inhale 2 puffs into the lungs every 6 (six) hours as needed.        . ALPRAZolam (XANAX) 0.5 MG tablet Take 0.5 mg by mouth 4 (four) times daily as needed.        . clobetasol cream (TEMOVATE) 0.05 %       . hydrochlorothiazide (HYDRODIURIL) 25 MG tablet Take 25 mg by mouth daily as needed.        . Hydrocodone-Acetaminophen 10-660 MG TABS as needed.      . nitroGLYCERIN (NITROSTAT) 0.4 MG SL tablet Place 0.4 mg under the tongue every 5 (five) minutes as needed.        Marland Kitchen oxyCODONE (OXYCONTIN) 15 MG TB12 Take 15 mg by mouth 4 (four) times daily as needed.        . tiotropium (SPIRIVA HANDIHALER) 18 MCG inhalation capsule Place 1 capsule (18 mcg total) into inhaler and inhale daily.  30 capsule  11       Physical Examination    Filed Vitals:   07/31/11 1126  BP: 153/84  Pulse: 95  Resp: 16  Height: 5\' 4"  (1.626 m)  Weight: 200 lb (90.719 kg)  SpO2:  93%     General:  Alert and oriented, no acute distress HEENT: Normal Pulmonary: Clear to auscultation bilaterally Cardiac: Regular Rate and Rhythm without murmur Abdomen: Soft, non-tender, non-distended, normal bowel sounds, no pulsatile mass Extremities: 2+ femoral pulses, 2+ right dorsalis pedis pulse, absent left dorsalis pedis pulse   DATA:  Ultrasound of the abdominal aorta was performed today. I reviewed and interpreted this study. This showed no evidence of endoleak. However there was overlying bowel gas and the aorta was partially and secured. Aortic diameter today was 4.05 cm compared to 4.2 cm in February 2012.  ASSESSMENT:  Doing well status post Gore Excluder stent graft repair aneurysm.  We will obtain a CT Angio the abdomen and pelvis at his next office visit in one year to make sure there is no endoleak since this was not an optimal study on ultrasound today    Fabienne Bruns, MD [facility]  [place] Office: [phone] Pager: [phone]

## 2011-08-08 NOTE — Procedures (Unsigned)
VASCULAR LAB EXAM  INDICATION:  Follow up AAA endograft placed 02/21/08.  HISTORY: Diabetes:  No. Cardiac:  No. Hypertension:  Yes.  EXAM:  AAA sac size 4.05 cm AP, 4.05 cm transverse.  Previous sac 07/12/10:  3.6 cm AP, 4.2 cm transverse.  IMPRESSION: 1. The aorta and endograft appear patent. 2. Slight increase in size of the aneurysmal sac surrounding the     endograft, compared to previous examination. 3. Endoleak cannot be ruled out due to overlying bowel gas; the     patient was not fasting.  ___________________________________________ Janetta Hora. Fields, MD  LT/MEDQ  D:  07/31/2011  T:  07/31/2011  Job:  [id number]

## 2011-08-26 ENCOUNTER — Ambulatory Visit: Payer: Medicare Other | Admitting: Internal Medicine

## 2011-10-31 ENCOUNTER — Telehealth: Payer: Self-pay | Admitting: Internal Medicine

## 2011-10-31 NOTE — Telephone Encounter (Signed)
Per CY-yes this is fine with him.

## 2011-10-31 NOTE — Telephone Encounter (Signed)
LMTCB x 1 

## 2011-10-31 NOTE — Telephone Encounter (Signed)
Please advise if this is okay thanks! 

## 2011-11-04 NOTE — Telephone Encounter (Signed)
Spoke with pt and scheduled appt with PW for 12-22-11. Pt notified of [place] location.

## 2011-11-04 NOTE — Telephone Encounter (Signed)
This is fine 

## 2011-11-04 NOTE — Telephone Encounter (Signed)
Patient returning call.

## 2011-11-04 NOTE — Telephone Encounter (Signed)
Spoke with pt. He wants to switch to PW in [place]. Is this okay? Please advise thanks!

## 2011-11-04 NOTE — Telephone Encounter (Signed)
lmomtcb x 2  

## 2011-12-22 ENCOUNTER — Encounter: Payer: Self-pay | Admitting: Critical Care Medicine

## 2011-12-22 ENCOUNTER — Ambulatory Visit (INDEPENDENT_AMBULATORY_CARE_PROVIDER_SITE_OTHER): Payer: Medicare Other | Admitting: Critical Care Medicine

## 2011-12-22 VITALS — BP 158/100 | HR 113 | Temp 98.3°F | Ht 64.0 in | Wt 205.0 lb

## 2011-12-22 DIAGNOSIS — J449 Chronic obstructive pulmonary disease, unspecified: Secondary | ICD-10-CM

## 2011-12-22 DIAGNOSIS — J439 Emphysema, unspecified: Secondary | ICD-10-CM

## 2011-12-22 DIAGNOSIS — J438 Other emphysema: Secondary | ICD-10-CM

## 2011-12-22 MED ORDER — FLUTICASONE-SALMETEROL 250-50 MCG/DOSE IN AEPB: INHALATION_SPRAY | RESPIRATORY_TRACT | Status: DC

## 2011-12-22 MED ORDER — ALBUTEROL SULFATE (2.5 MG/3ML) 0.083% IN NEBU: INHALATION_SOLUTION | RESPIRATORY_TRACT | Status: DC

## 2011-12-22 NOTE — Progress Notes (Signed)
Subjective:    Patient ID: Joe Rosario, male    DOB: [birth year], 71 y.o.   MRN: [id number]  HPI Comments: Pt with chronic shortness of breath. Worse since Lung Ca Rx.  2010  LUL.  Rx with XRT. No recurrence.  Dyspnea continues since XRT.  Difficulty with swallowing.  Pt saw [facility] 2010. Dx Copd.  Last PFT was one year ago.    Shortness of Breath This is a chronic problem. The current episode started more than 1 year ago. The problem occurs daily (Pt notes dyspnea with exertion and occ at rest if humid). The problem has been gradually worsening. Associated symptoms include chest pain and neck pain. Pertinent negatives include no abdominal pain, ear pain, fever, headaches, hemoptysis, leg pain, leg swelling, orthopnea, PND, rash, rhinorrhea, sore throat, sputum production, swollen glands, syncope, vomiting or wheezing. The symptoms are aggravated by weather changes, any activity and exercise. Associated symptoms comments: Notes dry cough Chronic back pain. Risk factors include smoking. He has tried beta agonist inhalers, steroid inhalers and ipratropium inhalers for the symptoms. The treatment provided moderate relief. His past medical history is significant for chronic lung disease and COPD. There is no history of allergies, aspirin allergies, asthma, bronchiolitis, CAD, DVT, a heart failure, PE, pneumonia or a recent surgery.   Past Medical History  Diagnosis Date  . DDD (degenerative disc disease), lumbosacral   . Psoriasis   . Tachycardia   . Irritable colon   . Unspecified essential hypertension   . Chronic sinusitis   . Lumbosacral spondylosis without myelopathy   . Anxiety   . Cervical disc displacement   . COPD (chronic obstructive pulmonary disease)   . Malignant neoplasm of bronchus and lung, unspecified site   . Bladder cancer   . Squamous cell carcinoma of scalp      Family History  Problem Relation Age of Onset  . Heart attack Mother   . Leukemia Brother      History     Social History  . Marital Status: Married    Spouse Name: N/A    Number of Children: 6  . Years of Education: N/A   Occupational History  . retired     Metal work for [employer]   Social History Main Topics  . Smoking status: Former Smoker -- 1.0 packs/day for 30 years    Types: Cigarettes    Quit date: 03/27/2010  . Smokeless tobacco: Former Neurosurgeon    Types: Chew   Comment: 2 years  . Alcohol Use: Yes  . Drug Use: No  . Sexually Active: Not on file   Other Topics Concern  . Not on file   Social History Narrative  . No narrative on file     No Known Allergies   Outpatient Prescriptions Prior to Visit  Medication Sig Dispense Refill  . ALPRAZolam (XANAX) 0.5 MG tablet Take 0.5 mg by mouth 2 (two) times daily.       . clobetasol cream (TEMOVATE) 0.05 % as needed.       . hydrochlorothiazide (HYDRODIURIL) 25 MG tablet Take 25 mg by mouth daily as needed.        . Hydrocodone-Acetaminophen 10-660 MG TABS as needed.      . nitroGLYCERIN (NITROSTAT) 0.4 MG SL tablet Place 0.4 mg under the tongue every 5 (five) minutes as needed.        Marland Kitchen oxyCODONE (OXYCONTIN) 15 MG TB12 Take 15 mg by mouth 4 (four) times daily as needed.        Marland Kitchen  tiotropium (SPIRIVA HANDIHALER) 18 MCG inhalation capsule Place 1 capsule (18 mcg total) into inhaler and inhale daily.  30 capsule  11  . albuterol (PROAIR HFA) 108 (90 BASE) MCG/ACT inhaler Inhale 2 puffs into the lungs every 6 (six) hours as needed.             Review of Systems  Constitutional: Positive for activity change, fatigue and unexpected weight change. Negative for fever, chills, diaphoresis and appetite change.  HENT: Positive for sneezing, trouble swallowing, neck pain, neck stiffness, dental problem and postnasal drip. Negative for hearing loss, ear pain, nosebleeds, congestion, sore throat, facial swelling, rhinorrhea, mouth sores, voice change, sinus pressure, tinnitus and ear discharge.   Eyes: Negative for photophobia, discharge,  itching and visual disturbance.  Respiratory: Positive for cough, chest tightness and shortness of breath. Negative for apnea, hemoptysis, sputum production, choking, wheezing and stridor.   Cardiovascular: Positive for chest pain. Negative for palpitations, orthopnea, leg swelling, syncope and PND.  Gastrointestinal: Negative for nausea, vomiting, abdominal pain, constipation, blood in stool and abdominal distention.  Genitourinary: Negative for dysuria, urgency, frequency, hematuria, flank pain, decreased urine volume and difficulty urinating.  Musculoskeletal: Negative for myalgias, back pain, joint swelling, arthralgias and gait problem.  Skin: Negative for color change, pallor and rash.  Neurological: Negative for dizziness, tremors, seizures, syncope, speech difficulty, weakness, light-headedness, numbness and headaches.  Hematological: Negative for adenopathy. Does not bruise/bleed easily.  Psychiatric/Behavioral: Negative for confusion, disturbed wake/sleep cycle and agitation. The patient is not nervous/anxious.        Objective:   Physical Exam Filed Vitals:   12/22/11 1030  BP: 158/100  Pulse: 113  Temp: 98.3 F (36.8 C)  TempSrc: Oral  Height: 5\' 4"  (1.626 m)  Weight: 92.987 kg (205 lb)  SpO2: 95%    Gen: Pleasant, well-nourished, in no distress,  normal affect  ENT: No lesions,  mouth clear,  oropharynx clear, no postnasal drip  Neck: No JVD, no TMG, no carotid bruits  Lungs: No use of accessory muscles, no dullness to percussion,distant bs  Cardiovascular: RRR, heart sounds normal, no murmur or gallops, no peripheral edema  Abdomen: soft and NT, no HSM,  BS normal  Musculoskeletal: No deformities, no cyanosis or clubbing  Neuro: alert, non focal  Skin: Warm, no lesions or rashes  CT Chest 08/2011 reviewed        Assessment & Plan:   COPD (chronic obstructive pulmonary disease) with emphysema PFT 04/15/11- FEV1 1.23/51%, FEV1/FVC 0.46, FEF 25-75% was  19% of predicted. Significant response to bronchodilator. Flow volume loop looks like emphysema. TLC 117%, RV 156%, DLCO 69%.  6 minute walk test 04/15/2011-room air, 98% at start, 92% at finish, 97% after 2 minutes recovery. He had to stop at 3 minutes 50 seconds because of dyspnea leg and hip pain. Heart rate than 132 with oxygen saturation 91% and BP 170/98. He rested 1 and 15 seconds then completed 6 minutes walk.  Advanced COPD with emphysema Golds C based on 11/12 PFTs Plan  repeat pulmonary function studies Change DuoNeb to albuterol in nebulizer 2-3 times daily Stay on spiriva daily Use Advair twice daily An overnight sleep oxygen test will be obtained in your home on room air Return 2 months [place]    Updated Medication List Outpatient Encounter Prescriptions as of 12/22/2011  Medication Sig Dispense Refill  . ALPRAZolam (XANAX) 0.5 MG tablet Take 0.5 mg by mouth 2 (two) times daily.       . clobetasol cream (  TEMOVATE) 0.05 % as needed.       . clotrimazole-betamethasone (LOTRISONE) cream as needed.      . digoxin (LANOXIN) 0.25 MG tablet as needed.      . Fluticasone-Salmeterol (ADVAIR DISKUS) 250-50 MCG/DOSE AEPB One puff twice daily  60 each    . furosemide (LASIX) 40 MG tablet daily.       . hydrochlorothiazide (HYDRODIURIL) 25 MG tablet Take 25 mg by mouth daily as needed.        . Hydrocodone-Acetaminophen 10-660 MG TABS as needed.      . nitroGLYCERIN (NITROSTAT) 0.4 MG SL tablet Place 0.4 mg under the tongue every 5 (five) minutes as needed.        Marland Kitchen oxyCODONE (OXYCONTIN) 15 MG TB12 Take 15 mg by mouth 4 (four) times daily as needed.        . tiotropium (SPIRIVA HANDIHALER) 18 MCG inhalation capsule Place 1 capsule (18 mcg total) into inhaler and inhale daily.  30 capsule  11  . DISCONTD: ADVAIR DISKUS 250-50 MCG/DOSE AEPB 1 puff BID times 48H.      . DISCONTD: ipratropium-albuterol (DUONEB) 0.5-2.5 (3) MG/3ML SOLN 2-3 times daily      . albuterol (PROAIR HFA) 108  (90 BASE) MCG/ACT inhaler Inhale 2 puffs into the lungs every 6 (six) hours as needed.        Marland Kitchen albuterol (PROVENTIL) (2.5 MG/3ML) 0.083% nebulizer solution Use 2 - 3 times daily  320 mL  12  . DISCONTD: atorvastatin (LIPITOR) 20 MG tablet

## 2011-12-22 NOTE — Patient Instructions (Addendum)
We will repeat pulmonary function studies Change DuoNeb to albuterol in nebulizer 2-3 times daily Stay on spiriva daily Use Advair twice daily An overnight sleep oxygen test will be obtained in your home on room air Return 2 months [place]

## 2011-12-22 NOTE — Assessment & Plan Note (Addendum)
PFT 04/15/11- FEV1 1.23/51%, FEV1/FVC 0.46, FEF 25-75% was 19% of predicted. Significant response to bronchodilator. Flow volume loop looks like emphysema. TLC 117%, RV 156%, DLCO 69%.  6 minute walk test 04/15/2011-room air, 98% at start, 92% at finish, 97% after 2 minutes recovery. He had to stop at 3 minutes 50 seconds because of dyspnea leg and hip pain. Heart rate than 132 with oxygen saturation 91% and BP 170/98. He rested 1 and 15 seconds then completed 6 minutes walk.  Advanced COPD with emphysema Golds C based on 11/12 PFTs Plan  repeat pulmonary function studies Change DuoNeb to albuterol in nebulizer 2-3 times daily Stay on spiriva daily Use Advair twice daily An overnight sleep oxygen test will be obtained in your home on room air Return 2 months [place]

## 2011-12-24 ENCOUNTER — Ambulatory Visit (INDEPENDENT_AMBULATORY_CARE_PROVIDER_SITE_OTHER): Payer: Medicare Other | Admitting: Critical Care Medicine

## 2011-12-24 DIAGNOSIS — J449 Chronic obstructive pulmonary disease, unspecified: Secondary | ICD-10-CM

## 2011-12-24 LAB — PULMONARY FUNCTION TEST

## 2011-12-24 NOTE — Progress Notes (Signed)
PFT done today. 

## 2011-12-26 ENCOUNTER — Telehealth: Payer: Self-pay | Admitting: Critical Care Medicine

## 2011-12-26 DIAGNOSIS — J439 Emphysema, unspecified: Secondary | ICD-10-CM

## 2011-12-26 NOTE — Telephone Encounter (Signed)
Call the pt and tell him PFTs have WORSENED since 11/12. Stay on breathing meds as discussed No other changes offered.

## 2011-12-26 NOTE — Telephone Encounter (Signed)
Called, spoke with pt.  I informed him of results and recs per Dr. Delford Field.  He verbalized understanding of this and asked I inform his wife of this as well.  I spoke with his wife.  I informed her of results and recs per Dr. Delford Field.  She verbalized understanding of this.  Per pt instructions from 12/22/11 OV with PW:   An overnight sleep oxygen test will be obtained in your home on room air  Wife states they still have not heard anything regarding this test being set up.  I do see order was placed.  Advised I would send msg to PCCs to assist.  PCCs, pls advise.  Thank you.

## 2011-12-26 NOTE — Telephone Encounter (Signed)
Spoke with Synetta Fail @ [facility] who has order for ONO, Patsy Lager has a company [employer] that does [employer], needed the form signed. Form was signed by Dr Delford Field but faxed to company by upfront staff.  Form refaxed to [facility] AttSynetta Fail, who will forward for ONO .Kandice Hams

## 2011-12-29 ENCOUNTER — Encounter: Payer: Self-pay | Admitting: Critical Care Medicine

## 2011-12-29 ENCOUNTER — Telehealth: Payer: Self-pay | Admitting: Critical Care Medicine

## 2011-12-29 NOTE — Telephone Encounter (Signed)
Order for ono was placed 12/22/11. Does not show anything has been done to order. Please advise PCc thanks

## 2011-12-29 NOTE — Telephone Encounter (Signed)
Pt is a [facility] pt spoke to wife and [facility] brought machine out today Tobe Sos

## 2012-01-08 ENCOUNTER — Telehealth: Payer: Self-pay | Admitting: Critical Care Medicine

## 2012-01-08 DIAGNOSIS — J439 Emphysema, unspecified: Secondary | ICD-10-CM

## 2012-01-08 NOTE — Telephone Encounter (Signed)
Called, spoke with pt.  I informed him of below per Dr. Delford Field.  Pt states he already has o2 at home.  Reports he is currently using 3L qhs and uses it during the day prn.  He was very concerned about his current condition and has several questions: 1.  Would like to know the lowest sat dropped to during ONO and if 2 L will be sufficient as he is currently using 3 L 2.  Would like to know the stage of his copd  3.  Would like to know if Dr. Delford Field would order portable o2 for him so he can travel.  States he cannot take the o2 he currently has on an airplane.  Pt specifically requesting a portable o2 tank that you carry on shoulders and has a "battery pack."   4.  Also, reports he has trouble swallowing at different times and is concerned about this.  Thinks he mentioned this to Dr. Delford Field during last OV but cannot remember and would like some recs.    Dr. Delford Field, pls advise.  Thank you.

## 2012-01-08 NOTE — Telephone Encounter (Signed)
He will need to stay on oxygen 3Liters at night Obtain for the pt a portable concentrator if possible from DME, medicare will likely not pay for this I am unaware of the swallowing issue and need to be addressed next OV

## 2012-01-08 NOTE — Telephone Encounter (Signed)
Call the pt, tell him ONO pos for desaturation. He will need  2Liter qhs at night   I sent order to [facility]

## 2012-01-09 NOTE — Telephone Encounter (Signed)
Called, spoke with pt.  I informed him of below per Dr. Delford Field.  He verbalized understanding of this. He is aware order will be placed for portable concentrator thru DME ([facility]) and there is a possibility that Medicare will not pay for this.  Also, he is aware to keep o2 on 3L qhs.  He verbalized understanding of this and is aware to discuss the swallowing issue with PW on next OV on Sept 26 at 1:30 pm in [place] as he wasn't aware pt was having swallowing issues.  Advised to call back if symptoms worsen or if anything is needed prior to this.  He verbalized understanding of all instructions and voiced no further questions/concerns at this time.  Note:  Spoke with Dr. Delford Field regarding o2 qhs order as order has already been placed for 2 lpm.  Per Dr. Delford Field, pls put in new order for o2 3 lpm qhs when placing order for portable concentrator.

## 2012-01-09 NOTE — Telephone Encounter (Signed)
Please send new order to [facility] for O2 on 3 lpm qhs. Previous order states 2 lpm. Pt uses [facility]. Rhonda J Cobb

## 2012-01-14 ENCOUNTER — Encounter: Payer: Self-pay | Admitting: Critical Care Medicine

## 2012-02-26 ENCOUNTER — Ambulatory Visit (INDEPENDENT_AMBULATORY_CARE_PROVIDER_SITE_OTHER): Payer: Medicare Other | Admitting: Critical Care Medicine

## 2012-02-26 ENCOUNTER — Encounter: Payer: Self-pay | Admitting: Critical Care Medicine

## 2012-02-26 VITALS — BP 142/82 | HR 134 | Temp 98.5°F | Ht 64.0 in | Wt 206.0 lb

## 2012-02-26 DIAGNOSIS — J449 Chronic obstructive pulmonary disease, unspecified: Secondary | ICD-10-CM

## 2012-02-26 DIAGNOSIS — J439 Emphysema, unspecified: Secondary | ICD-10-CM

## 2012-02-26 DIAGNOSIS — J438 Other emphysema: Secondary | ICD-10-CM

## 2012-02-26 DIAGNOSIS — Z23 Encounter for immunization: Secondary | ICD-10-CM

## 2012-02-26 NOTE — Progress Notes (Signed)
Subjective:    Patient ID: Joe Rosario, male    DOB: Nov 01, 1940, 71 y.o.   MRN: [id number]  HPI  02/26/2012 Since last OV. In the house no use of oxygen.  When outside is worse.   On nebulizer/spiriva/advair. No real mucus.  Pt wants a portable concentrator.  [facility] . Pt denies any significant sore throat, nasal congestion or excess secretions, fever, chills, sweats, unintended weight loss, pleurtic or exertional chest pain, orthopnea PND, or leg swelling Pt denies any increase in rescue therapy over baseline, denies waking up needing it or having any early am or nocturnal exacerbations of coughing/wheezing/or dyspnea. Pt also denies any obvious fluctuation in symptoms with  weather or environmental change or other alleviating or aggravating factors    Past Medical History  Diagnosis Date  . DDD (degenerative disc disease), lumbosacral   . Psoriasis   . Tachycardia   . Irritable colon   . Unspecified essential hypertension   . Chronic sinusitis   . Lumbosacral spondylosis without myelopathy   . Anxiety   . Cervical disc displacement   . COPD (chronic obstructive pulmonary disease)   . Malignant neoplasm of bronchus and lung, unspecified site   . Bladder cancer   . Squamous cell carcinoma of scalp      Family History  Problem Relation Age of Onset  . Heart attack Mother   . Leukemia Brother      History   Social History  . Marital Status: Married    Spouse Name: N/A    Number of Children: 6  . Years of Education: N/A   Occupational History  . retired     Metal work for [employer]   Social History Main Topics  . Smoking status: Former Smoker -- 1.0 packs/day for 30 years    Types: Cigarettes    Quit date: 03/27/2010  . Smokeless tobacco: Former Neurosurgeon    Types: Chew   Comment: 2 years  . Alcohol Use: Yes  . Drug Use: No  . Sexually Active: Not on file   Other Topics Concern  . Not on file   Social History Narrative  . No narrative on file     No Known  Allergies   Outpatient Prescriptions Prior to Visit  Medication Sig Dispense Refill  . albuterol (PROAIR HFA) 108 (90 BASE) MCG/ACT inhaler Inhale 2 puffs into the lungs every 6 (six) hours as needed.        Marland Kitchen albuterol (PROVENTIL) (2.5 MG/3ML) 0.083% nebulizer solution Use 2 - 3 times daily  320 mL  12  . ALPRAZolam (XANAX) 0.5 MG tablet Take 1 mg by mouth at bedtime.       . clobetasol cream (TEMOVATE) 0.05 % as needed.       . clotrimazole-betamethasone (LOTRISONE) cream as needed.      . digoxin (LANOXIN) 0.25 MG tablet as needed.      . Fluticasone-Salmeterol (ADVAIR DISKUS) 250-50 MCG/DOSE AEPB One puff twice daily  60 each    . furosemide (LASIX) 40 MG tablet daily as needed.       . hydrochlorothiazide (HYDRODIURIL) 25 MG tablet Take 25 mg by mouth daily as needed.        . Hydrocodone-Acetaminophen 10-660 MG TABS as needed.      . nitroGLYCERIN (NITROSTAT) 0.4 MG SL tablet Place 0.4 mg under the tongue every 5 (five) minutes as needed.        . tiotropium (SPIRIVA HANDIHALER) 18 MCG inhalation capsule Place 1 capsule (  18 mcg total) into inhaler and inhale daily.  30 capsule  11  . oxyCODONE (OXYCONTIN) 15 MG TB12 Take 15 mg by mouth 4 (four) times daily as needed.             Review of Systems  Constitutional: Positive for activity change, fatigue and unexpected weight change. Negative for chills, diaphoresis and appetite change.  HENT: Positive for sneezing, trouble swallowing, neck stiffness, dental problem and postnasal drip. Negative for hearing loss, nosebleeds, congestion, facial swelling, mouth sores, voice change, sinus pressure, tinnitus and ear discharge.   Eyes: Negative for photophobia, discharge, itching and visual disturbance.  Respiratory: Positive for cough and chest tightness. Negative for apnea, choking and stridor.   Cardiovascular: Negative for palpitations.  Gastrointestinal: Negative for nausea, constipation, blood in stool and abdominal distention.    Genitourinary: Negative for dysuria, urgency, frequency, hematuria, flank pain, decreased urine volume and difficulty urinating.  Musculoskeletal: Negative for myalgias, back pain, joint swelling, arthralgias and gait problem.  Skin: Negative for color change and pallor.  Neurological: Negative for dizziness, tremors, seizures, syncope, speech difficulty, weakness, light-headedness and numbness.  Hematological: Negative for adenopathy. Does not bruise/bleed easily.  Psychiatric/Behavioral: Negative for confusion, disturbed wake/sleep cycle and agitation. The patient is not nervous/anxious.        Objective:   Physical Exam  Filed Vitals:   02/26/12 1327  BP: 142/82  Pulse: 134  Temp: 98.5 F (36.9 C)  TempSrc: Oral  Height: 5\' 4"  (1.626 m)  Weight: 206 lb (93.441 kg)  SpO2: 95%    Gen: Pleasant, well-nourished, in no distress,  normal affect  ENT: No lesions,  mouth clear,  oropharynx clear, no postnasal drip  Neck: No JVD, no TMG, no carotid bruits  Lungs: No use of accessory muscles, no dullness to percussion,distant bs  Cardiovascular: RRR, heart sounds normal, no murmur or gallops, no peripheral edema  Abdomen: soft and NT, no HSM,  BS normal  Musculoskeletal: No deformities, no cyanosis or clubbing  Neuro: alert, non focal  Skin: Warm, no lesions or rashes          Assessment & Plan:   COPD (chronic obstructive pulmonary disease) with emphysema, Gold C Chronic obstructive lung disease gold stage C. with desaturation on exertion as well as nocturnal desaturation Plan Obtain for patient portable oxygen concentrator that dose to 3 L Maintain 3 L nasal cannula at bedtime Continue nebulized therapy and inhaled medications as prescribed Administer flu vaccine and Pneumovax at this visit    Updated Medication List Outpatient Encounter Prescriptions as of 02/26/2012  Medication Sig Dispense Refill  . albuterol (PROAIR HFA) 108 (90 BASE) MCG/ACT inhaler  Inhale 2 puffs into the lungs every 6 (six) hours as needed.        Marland Kitchen albuterol (PROVENTIL) (2.5 MG/3ML) 0.083% nebulizer solution Use 2 - 3 times daily  320 mL  12  . ALPRAZolam (XANAX) 0.5 MG tablet Take 1 mg by mouth at bedtime.       . clobetasol cream (TEMOVATE) 0.05 % as needed.       . clotrimazole-betamethasone (LOTRISONE) cream as needed.      . digoxin (LANOXIN) 0.25 MG tablet as needed.      . Fluticasone-Salmeterol (ADVAIR DISKUS) 250-50 MCG/DOSE AEPB One puff twice daily  60 each    . furosemide (LASIX) 40 MG tablet daily as needed.       . hydrochlorothiazide (HYDRODIURIL) 25 MG tablet Take 25 mg by mouth daily as needed.        Marland Kitchen  Hydrocodone-Acetaminophen 10-660 MG TABS as needed.      . nitroGLYCERIN (NITROSTAT) 0.4 MG SL tablet Place 0.4 mg under the tongue every 5 (five) minutes as needed.        Marland Kitchen oxyCODONE (OXYCONTIN) 20 MG 12 hr tablet Take 20 mg by mouth as needed.      . tiotropium (SPIRIVA HANDIHALER) 18 MCG inhalation capsule Place 1 capsule (18 mcg total) into inhaler and inhale daily.  30 capsule  11  . DISCONTD: oxyCODONE (OXYCONTIN) 15 MG TB12 Take 15 mg by mouth 4 (four) times daily as needed.

## 2012-02-26 NOTE — Patient Instructions (Addendum)
A portable concentrator will be obtained from [facility] Stay on 3Liters exertion with portable system Wear your oxygen at night 3Liters Stay on inhalers/nebulizer Flu vaccine and pneumovax were given Return 3 months

## 2012-02-26 NOTE — Assessment & Plan Note (Signed)
Chronic obstructive lung disease gold stage C. with desaturation on exertion as well as nocturnal desaturation Plan Obtain for patient portable oxygen concentrator that dose to 3 L Maintain 3 L nasal cannula at bedtime Continue nebulized therapy and inhaled medications as prescribed Administer flu vaccine and Pneumovax at this visit

## 2012-03-08 ENCOUNTER — Other Ambulatory Visit: Payer: Self-pay | Admitting: *Deleted

## 2012-03-08 DIAGNOSIS — I714 Abdominal aortic aneurysm, without rupture: Secondary | ICD-10-CM

## 2012-03-08 DIAGNOSIS — Z48812 Encounter for surgical aftercare following surgery on the circulatory system: Secondary | ICD-10-CM

## 2012-03-12 ENCOUNTER — Telehealth: Payer: Self-pay | Admitting: Critical Care Medicine

## 2012-03-12 NOTE — Telephone Encounter (Signed)
I spoke with pt and he stated he needs a letter from [facility] stating his current medical conditions. He is going to call his lawyer to see exactly what all he needs the letter to state and will call us back as well. Will await pt call back with this information.

## 2012-04-07 ENCOUNTER — Telehealth: Payer: Self-pay | Admitting: Critical Care Medicine

## 2012-04-07 DIAGNOSIS — J449 Chronic obstructive pulmonary disease, unspecified: Secondary | ICD-10-CM

## 2012-04-07 MED ORDER — ALBUTEROL SULFATE (2.5 MG/3ML) 0.083% IN NEBU: INHALATION_SOLUTION | RESPIRATORY_TRACT | Status: DC

## 2012-04-07 NOTE — Telephone Encounter (Signed)
Spoke with another rep at [facility] as Twyla was not there at the time; gave verbal to refill albuterol Rx.

## 2012-04-08 ENCOUNTER — Telehealth: Payer: Self-pay | Admitting: Critical Care Medicine

## 2012-04-08 DIAGNOSIS — J449 Chronic obstructive pulmonary disease, unspecified: Secondary | ICD-10-CM

## 2012-04-08 MED ORDER — ARFORMOTEROL TARTRATE 15 MCG/2ML IN NEBU: 15.0000 ug | INHALATION_SOLUTION | Freq: Two times a day (BID) | RESPIRATORY_TRACT | Status: DC

## 2012-04-08 MED ORDER — BUDESONIDE 0.25 MG/2ML IN SUSP: 0.2500 mg | Freq: Every day | RESPIRATORY_TRACT | Status: DC

## 2012-04-08 NOTE — Telephone Encounter (Signed)
Stop advair and spiriva Start brovana and budesonide 0.25mg  bid  And obtain nebulizer from [facility]

## 2012-04-08 NOTE — Telephone Encounter (Signed)
I spoke with the pt and he states that his spiriva and advair are putting him in the donut hole and he is wondering if these can be changed to nebulizer medications instead. He states his money with start over in Jan but he states these 2 inhalers cost him over $200 a month so he will be in the donut hole again in 2014 by summer time. Please advise.   Also the pt is asking for an order be placed for a portable concentrator with a battery pack. He states he though an order was supposed to be placed at last OV but he has not heard anything. I see that an order was placed and sent to [facility] but they state they never received it, so I spoke with Angelica Chessman from [facility] and she states they have a poc so if we can send an order she will work on getting this for the pt. Order placed.   Please advise on neb meds. Thanks. Carron Curie, CMA

## 2012-04-08 NOTE — Telephone Encounter (Signed)
Order sent to [facility] along with scripts for Brovana and Budesonide.  Pt has been notified of the chamge and verbalized understanding that once he finishes the Advair and Spiriva he has he will switch over to Brovana and Budesonide twice daily in the nebulizer.  Pt instructed to call if he has any questions.Joe Rosario

## 2012-05-31 ENCOUNTER — Telehealth: Payer: Self-pay | Admitting: Critical Care Medicine

## 2012-05-31 NOTE — Telephone Encounter (Signed)
Pt aware of Dr. Kavin Leech recommendations. He declined the medication. He will call back and make an appointment with Dr. Delford Field.

## 2012-05-31 NOTE — Telephone Encounter (Signed)
Please have him stop Advair, continue spiriva daily Call in for him fluconazole 200mg  on first day, then 100mg  po qd for 3 additional days Needs to set up an OV with Dr Delford Field to talk about the med options (ie when to restart either Advair or the nebulized meds)

## 2012-05-31 NOTE — Telephone Encounter (Signed)
Patient states he bought his inhalers Spiriva and Advair and they have put him in the Donut hole costing $200+ Patient states that he has a "white color" in his mouth, some pain that is uncomfortable and dry mouth since starting the Spiriva and Advair, he thinks. Patient seems very confused about his medications.   Last OV in September with Dr Delford Field he was instructed to stop the Spiriva and Advair d/t cost and start the Budesonide and Brovana Nebs in their place. He never stopped the inhalers and has not started the nebs as of today..patient is wanting to continue taking the inhalers until he finishes them since hes already paid for them and I encouraged him that if he has thrush that he may need to hold off until the Dr gets back to him.   Patient aware that Delford Field is out of office and that [facility]rum will be handling this message.   What should patient do in regards to takings his meds? Dr Delton Coombes please advise. Thanks

## 2012-06-09 ENCOUNTER — Telehealth: Payer: Self-pay | Admitting: Critical Care Medicine

## 2012-06-09 NOTE — Telephone Encounter (Signed)
Pt has some issues with some of his medications and was confused about when to take them. I have given clear instructions about when his medications should be taken. He is needing some samples of Spiriva since he is completely out. Those will be placed at the front for pick up. While talking to him I noticed that a follow up with Dr. Delford Field wasn't scheduled and I scheduled him for 07/13/12 @ 10:45am. The pt was very appreciative that I took the time to talk to him and help him. 15 minutes was spent on the phone with him.

## 2012-06-09 NOTE — Telephone Encounter (Signed)
Caller: Joe Rosario/Patient; Phone: [phone]; Reason for Call: Called with medication questions for Dr Delford Field, pulmonologist.  Given number to Pulmonary office and transferred to message queue.

## 2012-07-13 ENCOUNTER — Encounter: Payer: Self-pay | Admitting: Critical Care Medicine

## 2012-07-13 ENCOUNTER — Ambulatory Visit (INDEPENDENT_AMBULATORY_CARE_PROVIDER_SITE_OTHER): Payer: Medicare Other | Admitting: Critical Care Medicine

## 2012-07-13 VITALS — BP 142/92 | HR 108 | Temp 98.7°F | Ht 65.0 in | Wt 208.6 lb

## 2012-07-13 DIAGNOSIS — J438 Other emphysema: Secondary | ICD-10-CM

## 2012-07-13 DIAGNOSIS — J9610 Chronic respiratory failure, unspecified whether with hypoxia or hypercapnia: Secondary | ICD-10-CM | POA: Insufficient documentation

## 2012-07-13 DIAGNOSIS — J439 Emphysema, unspecified: Secondary | ICD-10-CM

## 2012-07-13 MED ORDER — CHLORPHENIRAMINE TANNATE 8 MG PO TABS: 8.0000 mg | ORAL_TABLET | Freq: Every day | ORAL | Status: DC

## 2012-07-13 MED ORDER — PREDNISONE 10 MG PO TABS: ORAL_TABLET | ORAL | Status: DC

## 2012-07-13 NOTE — Assessment & Plan Note (Signed)
Chronic obstructive lung disease with asthmatic bronchitic and emphysematous component with associated chronic respiratory failure gold stage C. Plan Try chlorpheniramine 8mg  at bedtime Prednisone 10mg  Take 4 for two days three for two days two for two days one for two days Stay on spiriva Stop advair No other medication changes Return 4 months

## 2012-07-13 NOTE — Assessment & Plan Note (Signed)
Chronic respiratory failure on the basis of gold stage C. COPD Continue chronic oxygen therapy

## 2012-07-13 NOTE — Patient Instructions (Addendum)
Try chlorpheniramine 8mg  at bedtime Prednisone 10mg  Take 4 for two days three for two days two for two days one for two days Stay on spiriva Stop advair No other medication changes Return 4 months

## 2012-07-13 NOTE — Progress Notes (Signed)
Subjective:    Patient ID: Joe Rosario, male    DOB: February 19, 1941, 72 y.o.   MRN: [id number]  HPI   07/13/2012 Pt is ok at rest, min exertion is a problem.  Pt notes some mucus large amount. Mucus is not discolored. No hemoptysis.  Notes mild chest pain on R side.  RLL area. Notes some edema in feet.    Past Medical History  Diagnosis Date  . DDD (degenerative disc disease), lumbosacral   . Psoriasis   . Tachycardia   . Irritable colon   . Unspecified essential hypertension   . Chronic sinusitis   . Lumbosacral spondylosis without myelopathy   . Anxiety   . Cervical disc displacement   . COPD (chronic obstructive pulmonary disease)   . Malignant neoplasm of bronchus and lung, unspecified site   . Bladder cancer   . Squamous cell carcinoma of scalp      Family History  Problem Relation Age of Onset  . Heart attack Mother   . Leukemia Brother      History   Social History  . Marital Status: Married    Spouse Name: N/A    Number of Children: 6  . Years of Education: N/A   Occupational History  . retired     Metal work for [employer]   Social History Main Topics  . Smoking status: Former Smoker -- 1.00 packs/day for 30 years    Types: Cigarettes    Quit date: 03/27/2010  . Smokeless tobacco: Former Neurosurgeon    Types: Chew     Comment: 2 years  . Alcohol Use: Yes  . Drug Use: No  . Sexually Active: Not on file   Other Topics Concern  . Not on file   Social History Narrative  . No narrative on file     No Known Allergies   Outpatient Prescriptions Prior to Visit  Medication Sig Dispense Refill  . albuterol (PROAIR HFA) 108 (90 BASE) MCG/ACT inhaler Inhale 2 puffs into the lungs every 6 (six) hours as needed.        . ALPRAZolam (XANAX) 0.5 MG tablet Take 1 mg by mouth at bedtime.       . clobetasol cream (TEMOVATE) 0.05 % as needed.       . clotrimazole-betamethasone (LOTRISONE) cream as needed.      . digoxin (LANOXIN) 0.25 MG tablet as needed.      .  furosemide (LASIX) 40 MG tablet daily as needed.       . hydrochlorothiazide (HYDRODIURIL) 25 MG tablet Take 25 mg by mouth daily as needed.        . nitroGLYCERIN (NITROSTAT) 0.4 MG SL tablet Place 0.4 mg under the tongue every 5 (five) minutes as needed.        . tiotropium (SPIRIVA HANDIHALER) 18 MCG inhalation capsule Place 1 capsule (18 mcg total) into inhaler and inhale daily.  30 capsule  11  . albuterol (PROVENTIL) (2.5 MG/3ML) 0.083% nebulizer solution Use 2 - 3 times daily  320 mL  12  . arformoterol (BROVANA) 15 MCG/2ML NEBU Take 2 mLs (15 mcg total) by nebulization 2 (two) times daily. DX:  496  120 mL  11  . budesonide (PULMICORT) 0.25 MG/2ML nebulizer solution Take 2 mLs (0.25 mg total) by nebulization daily. DX:  496  120 mL  11  . Fluticasone-Salmeterol (ADVAIR DISKUS) 250-50 MCG/DOSE AEPB One puff twice daily  60 each    . oxyCODONE (OXYCONTIN) 20 MG 12 hr  tablet Take 20 mg by mouth as needed.      . Hydrocodone-Acetaminophen 10-660 MG TABS as needed.       No facility-administered medications prior to visit.       Review of Systems  Constitutional: Positive for activity change, fatigue and unexpected weight change. Negative for chills, diaphoresis and appetite change.  HENT: Positive for sneezing, trouble swallowing, neck stiffness, dental problem and postnasal drip. Negative for hearing loss, nosebleeds, congestion, facial swelling, mouth sores, voice change, sinus pressure, tinnitus and ear discharge.   Eyes: Negative for photophobia, discharge, itching and visual disturbance.  Respiratory: Positive for cough and chest tightness. Negative for apnea, choking and stridor.   Cardiovascular: Negative for palpitations.  Gastrointestinal: Negative for nausea, constipation, blood in stool and abdominal distention.  Genitourinary: Negative for dysuria, urgency, frequency, hematuria, flank pain, decreased urine volume and difficulty urinating.  Musculoskeletal: Negative for  myalgias, back pain, joint swelling, arthralgias and gait problem.  Skin: Negative for color change and pallor.  Neurological: Negative for dizziness, tremors, seizures, syncope, speech difficulty, weakness, light-headedness and numbness.  Hematological: Negative for adenopathy. Does not bruise/bleed easily.  Psychiatric/Behavioral: Negative for confusion, sleep disturbance and agitation. The patient is not nervous/anxious.        Objective:   Physical Exam  Filed Vitals:   07/13/12 1111  BP: 142/92  Pulse: 108  Temp: 98.7 F (37.1 C)  TempSrc: Oral  Height: 5\' 5"  (1.651 m)  Weight: 208 lb 9.6 oz (94.62 kg)  SpO2: 95%    Gen: Pleasant, well-nourished, in no distress,  normal affect  ENT: No lesions,  mouth clear,  oropharynx clear, no postnasal drip  Neck: No JVD, no TMG, no carotid bruits  Lungs: No use of accessory muscles, no dullness to percussion,distant bs  Cardiovascular: RRR, heart sounds normal, no murmur or gallops, no peripheral edema  Abdomen: soft and NT, no HSM,  BS normal  Musculoskeletal: No deformities, no cyanosis or clubbing  Neuro: alert, non focal  Skin: Warm, no lesions or rashes     Assessment & Plan:   COPD (chronic obstructive pulmonary disease) with emphysema, Gold C Chronic obstructive lung disease with asthmatic bronchitic and emphysematous component with associated chronic respiratory failure gold stage C. Plan Try chlorpheniramine 8mg  at bedtime Prednisone 10mg  Take 4 for two days three for two days two for two days one for two days Stay on spiriva Stop advair No other medication changes Return 4 months   Chronic respiratory failure Chronic respiratory failure on the basis of gold stage C. COPD Continue chronic oxygen therapy    Updated Medication List Outpatient Encounter Prescriptions as of 07/13/2012  Medication Sig Dispense Refill  . albuterol (PROAIR HFA) 108 (90 BASE) MCG/ACT inhaler Inhale 2 puffs into the lungs every  6 (six) hours as needed.        Marland Kitchen albuterol (PROVENTIL) (2.5 MG/3ML) 0.083% nebulizer solution Use 2 - 3 times daily Pt takes as least once daily      . ALPRAZolam (XANAX) 0.5 MG tablet Take 1 mg by mouth at bedtime.       Marland Kitchen arformoterol (BROVANA) 15 MCG/2ML NEBU Take 15 mcg by nebulization 2 (two) times daily. DX:  496 Pt takes this when he can      . budesonide (PULMICORT) 0.25 MG/2ML nebulizer solution Take 0.25 mg by nebulization daily. DX:  496 Pt takes this when he can      . clobetasol cream (TEMOVATE) 0.05 % as needed.       Marland Kitchen  clotrimazole-betamethasone (LOTRISONE) cream as needed.      . digoxin (LANOXIN) 0.25 MG tablet as needed.      . furosemide (LASIX) 40 MG tablet daily as needed.       . hydrochlorothiazide (HYDRODIURIL) 25 MG tablet Take 25 mg by mouth daily as needed.        Marland Kitchen HYDROcodone-acetaminophen (NORCO) 10-325 MG per tablet Take 1 tablet by mouth 3 (three) times daily as needed for pain.      . nitroGLYCERIN (NITROSTAT) 0.4 MG SL tablet Place 0.4 mg under the tongue every 5 (five) minutes as needed.        . tiotropium (SPIRIVA HANDIHALER) 18 MCG inhalation capsule Place 1 capsule (18 mcg total) into inhaler and inhale daily.  30 capsule  11  . [DISCONTINUED] albuterol (PROVENTIL) (2.5 MG/3ML) 0.083% nebulizer solution Use 2 - 3 times daily  320 mL  12  . [DISCONTINUED] arformoterol (BROVANA) 15 MCG/2ML NEBU Take 2 mLs (15 mcg total) by nebulization 2 (two) times daily. DX:  496  120 mL  11  . [DISCONTINUED] budesonide (PULMICORT) 0.25 MG/2ML nebulizer solution Take 2 mLs (0.25 mg total) by nebulization daily. DX:  496  120 mL  11  . [DISCONTINUED] Fluticasone-Salmeterol (ADVAIR DISKUS) 250-50 MCG/DOSE AEPB One puff twice daily  60 each    . [DISCONTINUED] Fluticasone-Salmeterol (ADVAIR) 250-50 MCG/DOSE AEPB Trying to take this at least one puff once daily      . [DISCONTINUED] oxyCODONE (OXYCONTIN) 20 MG 12 hr tablet Take 20 mg by mouth as needed.      .  Chlorpheniramine Tannate 8 MG TABS Take 1 tablet (8 mg total) by mouth at bedtime.  30 each  6  . predniSONE (DELTASONE) 10 MG tablet Take 4 for two days three for two days two for two days one for two days  20 tablet  0  . [DISCONTINUED] Hydrocodone-Acetaminophen 10-660 MG TABS as needed.       No facility-administered encounter medications on file as of 07/13/2012.

## 2012-07-29 ENCOUNTER — Ambulatory Visit: Payer: Medicare Other | Admitting: Vascular Surgery

## 2012-07-29 ENCOUNTER — Other Ambulatory Visit: Payer: Medicare Other

## 2012-07-31 ENCOUNTER — Other Ambulatory Visit: Payer: Self-pay | Admitting: Internal Medicine

## 2012-08-12 ENCOUNTER — Telehealth: Payer: Self-pay | Admitting: *Deleted

## 2012-08-12 NOTE — Telephone Encounter (Signed)
Received fax from [facility] for a PA for albuterol nebs and brovana nebs at the [facility] Office.  I called [facility] in [place] as this is the listed pharm in pt's chart, spoke with [facility]. Was advised they do not have any rxs for either of these meds for pt. I called, spoke with pt who states he gets both of these medications through [facility].   I called [facility], Spoke with Synetta Fail.  Synetta Fail states they last shipped budesonide and brovana to pt on Apr 15, 2012 for a 1 month supply.  Albuterol was last shipped on Apr 07, 2012 for a 30 day supply.    For the PA for brovana and albuterol, Synetta Fail states they do not need this as pt has [employer] but pt does have a 20% copay.  Per Synetta Fail, the [facility] will be $63.85 for a 1 month supply, budesonide with be $59.04 for a 1 month supply, and albuterol neb will be $3.48 for a 1 month supply --- this will be pt's 20% copay price.  Unsure if pt is getting this through another pharmacy d/t the last shipment they sent. Therefore, I called pt back.  Pt states he is getting the brovana and albuterol through [facility].  Pt states he cannot remember the last shipment he received and stated November 2013 sounds like it could be right.  Pt states he hasn't gotten these meds filled through any other pharmacy other than [facility] and reported they did call him "yesterday I think" inquiring when to send him another shipment.  Pt states he told them he would have to look at his supply and would call them back.  I have advised pt to bring ALL of his medications that is he currently taking - prn and scheduled--- with him to OV tomorrow with TP.  Pt verbalized understanding of this.   ** NOTE:  When speaking with the pt the first time, he c/o having "hard, labored" breathing.  Reports breathing is getting worse since last OV.  States he feels "wore out" and is having to use crutches to get around d/t his breathing.  Pt states he is using the albuterol neb but "don't know if it is helping."   We have scheduled pt to see TP tomorrow at [facility] at 10:15 am -- pt is aware of this.  He also stated he is having difficulty affording his medications and will have more of a problem with this when he goes into the donut hole.  I have printed pt assistance forms for Spiriva and will give to Shanda Bumps to give to pt during OV tomorrow.  Pt is aware to fill this out and bring it back with the needed documents so we can fill out the rest and send into company with rx.  He verbalized understanding of this and voiced no further questions or concerns at this time.

## 2012-08-13 ENCOUNTER — Ambulatory Visit (INDEPENDENT_AMBULATORY_CARE_PROVIDER_SITE_OTHER): Payer: Medicare Other | Admitting: Adult Health

## 2012-08-13 ENCOUNTER — Encounter: Payer: Self-pay | Admitting: Adult Health

## 2012-08-13 ENCOUNTER — Ambulatory Visit (INDEPENDENT_AMBULATORY_CARE_PROVIDER_SITE_OTHER)
Admission: RE | Admit: 2012-08-13 | Discharge: 2012-08-13 | Disposition: A | Discharge status: Home / Self Care | Payer: Medicare Other | Source: Ambulatory Visit | Attending: Adult Health | Admitting: Adult Health

## 2012-08-13 ENCOUNTER — Other Ambulatory Visit (INDEPENDENT_AMBULATORY_CARE_PROVIDER_SITE_OTHER): Payer: Medicare Other

## 2012-08-13 VITALS — BP 124/68 | HR 117 | Temp 98.3°F | Ht 65.0 in | Wt 208.0 lb

## 2012-08-13 DIAGNOSIS — J439 Emphysema, unspecified: Secondary | ICD-10-CM

## 2012-08-13 DIAGNOSIS — J438 Other emphysema: Secondary | ICD-10-CM

## 2012-08-13 DIAGNOSIS — R0609 Other forms of dyspnea: Secondary | ICD-10-CM

## 2012-08-13 LAB — BASIC METABOLIC PANEL
Chloride: 100 mEq/L (ref 96–112)
GFR: 78.53 mL/min (ref >60.00)
Potassium: 3.5 mEq/L (ref 3.5–5.1)
Sodium: 138 mEq/L (ref 135–145)

## 2012-08-13 LAB — BRAIN NATRIURETIC PEPTIDE: Pro B Natriuretic peptide (BNP): 414 pg/mL — ABNORMAL HIGH (ref 0.0–100.0)

## 2012-08-13 MED ORDER — DOXYCYCLINE HYCLATE 100 MG PO TABS: 100.0000 mg | ORAL_TABLET | Freq: Two times a day (BID) | ORAL | Status: DC

## 2012-08-13 MED ORDER — PREDNISONE 10 MG PO TABS: ORAL_TABLET | ORAL | Status: DC

## 2012-08-13 NOTE — Patient Instructions (Addendum)
Prednisone taper over next week.  Mucinex DM Twice daily  As needed  Cough/congestion.  Doxycycline 100mg  Twice daily  For 7 days -take with food.  Make sure you take Spiriva 1 puff daily  Take Budesonide and Brovana Neb Twice daily   I will call with xray and lab results.  Take extra Furosemide 40mg  daily x 2 days  Low salt diet  Please contact office for sooner follow up if symptoms do not improve or worsen or seek emergency care  Follow up Dr. Delford Field  In 6-8 weeks

## 2012-08-13 NOTE — Progress Notes (Signed)
  Subjective:    Patient ID: Joe Rosario, male    DOB: 1941/03/14, 72 y.o.   MRN: [id number]  HPI 72 yo male with known hx of COPD  Previous hx of Lung Cancer -LUL mass in 10/2009 s/p chemo /rad   08/13/2012 Follow up  Does feel breathing is not as good for last 2 weeks.  More DOE w/ minimal activity, some mild wheezing, dry cough. Does have some coughing fits at times. -this seems to be chronic . No fever or discolored mucus .  Some leg swelling , worse in evening.  Not taking nebs /spiriva over last 2 weeks.  Restarted last few days on nebs /inhaler.      Review of Systems Constitutional:   No  weight loss, night sweats,  Fevers, chills, + fatigue, or  lassitude.  HEENT:   No headaches,  Difficulty swallowing,  Tooth/dental problems, or  Sore throat,                No sneezing, itching, ear ache,  +nasal congestion, post nasal drip,   CV:  No chest pain,  Orthopnea, PND,  anasarca, dizziness, palpitations, syncope.   GI  No heartburn, indigestion, abdominal pain, nausea, vomiting, diarrhea, change in bowel habits, loss of appetite, bloody stools.   Resp:    No chest wall deformity  Skin: no rash or lesions.  GU: no dysuria, change in color of urine, no urgency or frequency.  No flank pain, no hematuria   MS:  No joint pain or swelling.  No decreased range of motion.  No back pain.  Psych:  No change in mood or affect. No depression or anxiety.  No memory loss.         Objective:   Physical Exam GEN: A/Ox3; pleasant , NAD chronically ill appearing   HEENT:  [place]/AT,  EACs-clear, TMs-wnl, NOSE-clear, THROAT-clear, no lesions, no postnasal drip or exudate noted.   NECK:  Supple w/ fair ROM; no JVD; normal carotid impulses w/o bruits; no thyromegaly or nodules palpated; no lymphadenopathy.  RESP  Diminshed BS in bases no accessory muscle use, no dullness to percussion  CARD:  RRR, no m/r/g  , tr peripheral edema, pulses intact, no cyanosis or clubbing.  GI:   Soft &  nt; nml bowel sounds; no organomegaly or masses detected.  Musco: Warm bil, no deformities or joint swelling noted.   Neuro: alert, no focal deficits noted.    Skin: Warm, no lesions or rashes         Assessment & Plan:

## 2012-08-13 NOTE — Assessment & Plan Note (Signed)
Exacerbation w/ suspected volume overload Check labs and xray  ? Med noncompliance  Plan  Prednisone taper over next week.  Mucinex DM Twice daily  As needed  Cough/congestion.  Doxycycline 100mg  Twice daily  For 7 days -take with food.  Make sure you take Spiriva 1 puff daily  Take Budesonide and Brovana Neb Twice daily   I will call with xray and lab results.  Take extra Furosemide 40mg  daily x 2 days  Low salt diet  Please contact office for sooner follow up if symptoms do not improve or worsen or seek emergency care  Follow up Dr. Delford Field  In 6-8 weeks

## 2012-08-16 NOTE — Progress Notes (Signed)
Quick Note:  Called spoke with patient's spouse, advised of cxr results / recs as stated by TP. Spouse verbalized her understanding and denied any questions. ______

## 2012-08-16 NOTE — Progress Notes (Signed)
Quick Note:  Called spoke with patient's spouse, advised of cxr results / recs as stated by TP. Spouse verbalized her understanding and denied any questions. ______ 

## 2012-08-23 ENCOUNTER — Other Ambulatory Visit: Payer: Self-pay | Admitting: Vascular Surgery

## 2012-08-23 LAB — BUN: BUN: 27 mg/dL — ABNORMAL HIGH (ref 6–23)

## 2012-08-23 LAB — CREATININE, SERUM: Creat: 0.8 mg/dL (ref 0.50–1.35)

## 2012-08-25 ENCOUNTER — Encounter: Payer: Self-pay | Admitting: Vascular Surgery

## 2012-08-26 ENCOUNTER — Ambulatory Visit (INDEPENDENT_AMBULATORY_CARE_PROVIDER_SITE_OTHER): Payer: Medicare Other | Admitting: Vascular Surgery

## 2012-08-26 ENCOUNTER — Ambulatory Visit
Admission: RE | Admit: 2012-08-26 | Discharge: 2012-08-26 | Disposition: A | Discharge status: Home / Self Care | Payer: Medicare Other | Source: Ambulatory Visit | Attending: Vascular Surgery | Admitting: Vascular Surgery

## 2012-08-26 ENCOUNTER — Encounter: Payer: Self-pay | Admitting: Vascular Surgery

## 2012-08-26 VITALS — BP 146/80 | HR 108 | Ht 65.0 in | Wt 208.0 lb

## 2012-08-26 DIAGNOSIS — Z48812 Encounter for surgical aftercare following surgery on the circulatory system: Secondary | ICD-10-CM

## 2012-08-26 DIAGNOSIS — I714 Abdominal aortic aneurysm, without rupture: Secondary | ICD-10-CM

## 2012-08-26 MED ORDER — IOHEXOL 350 MG/ML SOLN
100.0000 mL | Freq: Once | INTRAVENOUS | Status: AC | PRN
  Administered 2012-08-26: 100 mL via INTRAVENOUS

## 2012-08-26 NOTE — Progress Notes (Signed)
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 He returns today for further followup. Overall he has been doing well since his last visit here. However his lung function has deteriorated somewhat and he is on continuous home oxygen. He states he gets occasional chest pain which is improved with nitroglycerin. He is being followed by his primary care physician for this. He denies any abdominal or back pain. He states he has chronic right hip pain. He was evaluated for this but states he wants a second opinion.  Past Medical History  Diagnosis Date  . DDD (degenerative disc disease), lumbosacral   . Psoriasis   . Tachycardia   . Irritable colon   . Unspecified essential hypertension   . Chronic sinusitis   . Lumbosacral spondylosis without myelopathy   . Anxiety   . Cervical disc displacement   . COPD (chronic obstructive pulmonary disease)   . Malignant neoplasm of bronchus and lung, unspecified site   . Bladder cancer   . Squamous cell carcinoma of scalp      Past Surgical History  Procedure Laterality Date  . Rotator cuff repair      right  . Appendectomy    . Shoulder surgery      left  . Tonsillectomy    . Hip fracture surgery      right  . Abdominal aortic aneurysm repair      Current Outpatient Prescriptions on File Prior to Visit  Medication Sig Dispense Refill  . albuterol (PROVENTIL) (2.5 MG/3ML) 0.083% nebulizer solution Use 2 - 3 times daily Pt takes as least once daily      . ALPRAZolam (XANAX) 0.5 MG tablet Take 1 mg by mouth at bedtime.       Marland Kitchen arformoterol (BROVANA) 15 MCG/2ML NEBU Take 15 mcg by nebulization 2 (two) times daily. DX:  496 Pt takes this when he can      . budesonide (PULMICORT) 0.25 MG/2ML nebulizer solution Take 0.25 mg by nebulization daily. DX:  496 Pt takes this when he can      . Chlorpheniramine Tannate 8 MG TABS Take 1 tablet (8 mg total) by mouth at bedtime.  30 each  6  .  clobetasol cream (TEMOVATE) 0.05 % as needed.       . clotrimazole-betamethasone (LOTRISONE) cream as needed.      . digoxin (LANOXIN) 0.25 MG tablet as needed.      . doxycycline (VIBRA-TABS) 100 MG tablet Take 1 tablet (100 mg total) by mouth 2 (two) times daily.  14 tablet  0  . furosemide (LASIX) 40 MG tablet daily as needed.       . hydrochlorothiazide (HYDRODIURIL) 25 MG tablet Take 25 mg by mouth daily as needed.        Marland Kitchen HYDROcodone-acetaminophen (NORCO) 10-325 MG per tablet Take 1 tablet by mouth 3 (three) times daily as needed for pain.      . nitroGLYCERIN (NITROSTAT) 0.4 MG SL tablet Place 0.4 mg under the tongue every 5 (five) minutes as needed.        Marland Kitchen SPIRIVA HANDIHALER 18 MCG inhalation capsule PLACE 1 CAPSULE (18 MCG TOTAL) INTO INHALER AND INHALE DAILY.  30 capsule  6  . albuterol (PROAIR HFA) 108 (90 BASE) MCG/ACT inhaler Inhale 2 puffs into the lungs every 6 (six) hours as needed.        . predniSONE (DELTASONE) 10 MG tablet 4 tabs for 2 days, then  3 tabs for 2 days, 2 tabs for 2 days, then 1 tab for 2 days, then stop  20 tablet  0   No current facility-administered medications on file prior to visit.   No Known Allergies   Review of systems: He has shortness of breath with minimal exertion. Chest pain as described above. He denies weight loss fever or chills.  Physical exam: Filed Vitals:   08/26/12 1236  BP: 146/80  Pulse: 108  Height: 5\' 5"  (1.651 m)  Weight: 208 lb (94.348 kg)  SpO2: 96%   Neck: No carotid bruits  Chest: Clear to auscultation bilaterally  Cardiac: Regular rate and rhythm  Abdomen: Obese soft nontender nondistended no palpable mass 2+ femoral pulses  Data: CT angiogram the abdomen and pelvis is reviewed today. His aneurysm diameter was 5.4 cm preoperatively. It measures 4.3 cm today. There is no evidence of stent migration. Renal and mesenteric arteries are patent.  Assessment: Well excluded aneurysm with no complicating features around  his Gore Excluder stent graft  Plan: Followup ultrasound in the office in one year. I have also referred him to Dr. Allie Bossier with orthopedics in [facility] for a second opinion for his hip evaluation. The patient will obtain his x-rays and take with him to this office visit.  Fabienne Bruns, MD [facility] Office: [phone] Pager: [phone]

## 2012-08-27 NOTE — Addendum Note (Signed)
Addended by: Melodye Ped C on: 08/27/2012 11:43 AM   Modules accepted: Orders

## 2012-09-02 ENCOUNTER — Telehealth: Payer: Self-pay | Admitting: Critical Care Medicine

## 2012-09-02 NOTE — Telephone Encounter (Signed)
I spoke with pt. He stated last week he had a problem where he felt like he couldn't breathe and it brought tears to his eyes. He was scared to death. I scheduled pt to come in for acute visit tomorrow 09/03/12 w/ RA at 11:30. Will forward to PW as an [place]

## 2012-09-02 NOTE — Telephone Encounter (Signed)
Noted and agree. 

## 2012-09-03 ENCOUNTER — Ambulatory Visit (INDEPENDENT_AMBULATORY_CARE_PROVIDER_SITE_OTHER): Payer: Medicare Other | Admitting: Pulmonary Disease

## 2012-09-03 ENCOUNTER — Encounter: Payer: Self-pay | Admitting: Pulmonary Disease

## 2012-09-03 VITALS — BP 138/88 | HR 98 | Temp 96.3°F | Ht 64.0 in | Wt 208.6 lb

## 2012-09-03 DIAGNOSIS — J438 Other emphysema: Secondary | ICD-10-CM

## 2012-09-03 DIAGNOSIS — J439 Emphysema, unspecified: Secondary | ICD-10-CM

## 2012-09-03 MED ORDER — FUROSEMIDE 40 MG PO TABS: ORAL_TABLET | ORAL | Status: DC

## 2012-09-03 MED ORDER — BUDESONIDE 0.25 MG/2ML IN SUSP: 0.2500 mg | Freq: Two times a day (BID) | RESPIRATORY_TRACT | Status: AC

## 2012-09-03 MED ORDER — PREDNISONE 10 MG PO TABS: ORAL_TABLET | ORAL | Status: DC

## 2012-09-03 NOTE — Progress Notes (Signed)
  Subjective:    Patient ID: Joe Rosario, male    DOB: November 03, 1940, 72 y.o.   MRN: [id number]  HPI 72 yo male with known hx of COPD , fev1 39% 7/13 Previous hx of Lung Cancer -LUL mass in 10/2009 s/p chemo /rad   08/13/2012   More DOE w/ minimal activity, some mild wheezing, dry cough. Does have some coughing fits at times. -this seems to be chronic . No fever or discolored mucus .  Some leg swelling , worse in evening.  Not taking nebs /spiriva over last 2 weeks.  Restarted last few days on nebs /inhaler. >> extra lasix, pred taper  09/03/2012 Pw pt. c/o increase SOB w/ exertion. C/o cough w/ clear phlem, wheezing. Pt uses 3-4 liters O2. Pt wants to know if he needs ot be using the albuterol neb.  CXR 07/2012 chronic changes He started on lasix yesterday for pedal edema Gets O2 from [facility] He is confused about his nebs - takes budesonide once/daily Complains about expense  Past Medical History  Diagnosis Date  . DDD (degenerative disc disease), lumbosacral   . Psoriasis   . Tachycardia   . Irritable colon   . Unspecified essential hypertension   . Chronic sinusitis   . Lumbosacral spondylosis without myelopathy   . Anxiety   . Cervical disc displacement   . COPD (chronic obstructive pulmonary disease)   . Malignant neoplasm of bronchus and lung, unspecified site   . Bladder cancer   . Squamous cell carcinoma of scalp       Review of Systems neg for any significant sore throat, dysphagia, itching, sneezing, nasal congestion or excess/ purulent secretions, fever, chills, sweats, unintended wt loss, pleuritic or exertional cp, hempoptysis, orthopnea pnd or change in chronic leg swelling. Also denies presyncope, palpitations, heartburn, abdominal pain, nausea, vomiting, diarrhea or change in bowel or urinary habits, dysuria,hematuria, rash, arthralgias, visual complaints, headache, numbness weakness or ataxia.     Objective:   Physical Exam  Gen. Pleasant, well-nourished, in no  distress, normal affect ENT - no lesions, no post nasal drip Neck: No JVD, no thyromegaly, no carotid bruits Lungs: no use of accessory muscles, no dullness to percussion, clear without rales or rhonchi  Cardiovascular: Rhythm regular, heart sounds  normal, no murmurs or gallops, 2+ peripheral edema Abdomen: soft and non-tender, no hepatosplenomegaly, BS normal. Musculoskeletal: No deformities, no cyanosis or clubbing Neuro:  alert, non focal        Assessment & Plan:

## 2012-09-03 NOTE — Assessment & Plan Note (Addendum)
Prednisone 10 mg  -Take 4 tabs  daily with food x 4 days, then 3 tabs daily x 4 days, then 2 tabs daily x 4 days, then 1 tab daily x4 days then stop. #40 Take lasix daily x 2 weeks Take budesonide & brovana twice daily Refills on budesonide & lasix Spent extra time clarifying meds & regimen, we called DME for cost

## 2012-09-03 NOTE — Patient Instructions (Addendum)
Prednisone 10 mg  -Take 4 tabs  daily with food x 4 days, then 3 tabs daily x 4 days, then 2 tabs daily x 4 days, then 1 tab daily x4 days then stop. #40 Take lasix daily x 2 weeks Take budesonide & brovana twice daily Refills on budesonide & lasix

## 2012-09-09 ENCOUNTER — Ambulatory Visit: Payer: Medicare Other | Admitting: Critical Care Medicine

## 2012-09-09 ENCOUNTER — Encounter: Payer: Self-pay | Admitting: Critical Care Medicine

## 2012-09-09 ENCOUNTER — Ambulatory Visit (INDEPENDENT_AMBULATORY_CARE_PROVIDER_SITE_OTHER): Payer: Medicare Other | Admitting: Critical Care Medicine

## 2012-09-09 VITALS — BP 118/88 | HR 61 | Temp 98.3°F | Ht 65.0 in | Wt 208.5 lb

## 2012-09-09 DIAGNOSIS — J438 Other emphysema: Secondary | ICD-10-CM

## 2012-09-09 DIAGNOSIS — J439 Emphysema, unspecified: Secondary | ICD-10-CM

## 2012-09-09 DIAGNOSIS — E876 Hypokalemia: Secondary | ICD-10-CM

## 2012-09-09 MED ORDER — TIOTROPIUM BROMIDE MONOHYDRATE 18 MCG IN CAPS: ORAL_CAPSULE | RESPIRATORY_TRACT | Status: AC

## 2012-09-09 MED ORDER — FUROSEMIDE 40 MG PO TABS: 40.0000 mg | ORAL_TABLET | Freq: Every day | ORAL | Status: DC

## 2012-09-09 NOTE — Patient Instructions (Addendum)
Stop hydrochlorthiazide Increase lasix to one twice daily Finish prednisone Use oxygen all the time Stay on nebulizer medications LAbs today Return 2 months  

## 2012-09-09 NOTE — Progress Notes (Addendum)
Subjective:    Patient ID: TAMARA KENYON, male    DOB: 1941/03/03, 72 y.o.   MRN: [id number]  HPI  72 yo male with known hx of COPD  Previous hx of Lung Cancer -LUL mass in 10/2009 s/p chemo /rad   3/17 /2014 Follow up  Does feel breathing is not as good for last 2 weeks.  More DOE w/ minimal activity, some mild wheezing, dry cough. Does have some coughing fits at times. -this seems to be chronic . No fever or discolored mucus .  Some leg swelling , worse in evening.  Not taking nebs /spiriva over last 2 weeks.  Restarted last few days on nebs /inhaler.   72 yo male with known hx of COPD , fev1 39% 7/13 Previous hx of Lung Cancer -LUL mass in 10/2009 s/p chemo /rad   08/13/2012   More DOE w/ minimal activity, some mild wheezing, dry cough. Does have some coughing fits at times. -this seems to be chronic . No fever or discolored mucus .  Some leg swelling , worse in evening.  Not taking nebs /spiriva over last 2 weeks.  Restarted last few days on nebs /inhaler. >> extra lasix, pred taper  09/03/2012 Pw pt. c/o increase SOB w/ exertion. C/o cough w/ clear phlem, wheezing. Pt uses 3-4 liters O2. Pt wants to know if he needs ot be using the albuterol neb.  CXR 07/2012 chronic changes He started on lasix yesterday for pedal edema Gets O2 from [facility] He is confused about his nebs - takes budesonide once/daily Complains about expense  09/09/2012 Pt continues to be dyspneic, has edema in feet. Pt notes cough. No chest pain. No wheeze.  Mucus is minimal. Pt here for f/u after seeing NP. Pt confused on meds  Past Medical History  Diagnosis Date  . DDD (degenerative disc disease), lumbosacral   . Psoriasis   . Tachycardia   . Irritable colon   . Unspecified essential hypertension   . Chronic sinusitis   . Lumbosacral spondylosis without myelopathy   . Anxiety   . Cervical disc displacement   . COPD (chronic obstructive pulmonary disease)   . Malignant neoplasm of bronchus and lung,  unspecified site   . Bladder cancer   . Squamous cell carcinoma of scalp      Family History  Problem Relation Age of Onset  . Heart attack Mother   . Leukemia Brother      History   Social History  . Marital Status: Married    Spouse Name: N/A    Number of Children: 6  . Years of Education: N/A   Occupational History  . retired     Metal work for [employer]   Social History Main Topics  . Smoking status: Former Smoker -- 1.00 packs/day for 30 years    Types: Cigarettes    Quit date: 03/27/2010  . Smokeless tobacco: Former Neurosurgeon    Types: Chew     Comment: 2 years  . Alcohol Use: Yes     Comment: takes whisky for pain  . Drug Use: No  . Sexually Active: Not on file   Other Topics Concern  . Not on file   Social History Narrative  . No narrative on file     No Known Allergies   Outpatient Prescriptions Prior to Visit  Medication Sig Dispense Refill  . ALPRAZolam (XANAX) 0.5 MG tablet Take 1 mg by mouth at bedtime.       Marland Kitchen arformoterol (  BROVANA) 15 MCG/2ML NEBU Take 15 mcg by nebulization 2 (two) times daily. DX:  496 Pt takes this when he can      . budesonide (PULMICORT) 0.25 MG/2ML nebulizer solution Take 2 mLs (0.25 mg total) by nebulization 2 (two) times daily. DX:  496 FILE UNDER MEDICARE PART B  120 mL  3  . clobetasol cream (TEMOVATE) 0.05 % as needed.       . clotrimazole-betamethasone (LOTRISONE) cream as needed.      . digoxin (LANOXIN) 0.25 MG tablet as needed.      Marland Kitchen HYDROcodone-acetaminophen (NORCO) 10-325 MG per tablet Take 1 tablet by mouth 3 (three) times daily as needed for pain.      . nitroGLYCERIN (NITROSTAT) 0.4 MG SL tablet Place 0.4 mg under the tongue every 5 (five) minutes as needed.        . predniSONE (DELTASONE) 10 MG tablet Take 4 tabs  daily with food x 4 days, then 3 tabs daily x 4 days, then 2 tabs daily x 4 days, then 1 tab daily x4 days then stop. #40  40 tablet  0  . furosemide (LASIX) 40 MG tablet Take once a day x 2 weeks then  as needed  30 tablet  0  . hydrochlorothiazide (HYDRODIURIL) 25 MG tablet Take 25 mg by mouth daily as needed.        Marland Kitchen SPIRIVA HANDIHALER 18 MCG inhalation capsule PLACE 1 CAPSULE (18 MCG TOTAL) INTO INHALER AND INHALE DAILY.  30 capsule  6  . albuterol (PROAIR HFA) 108 (90 BASE) MCG/ACT inhaler Inhale 2 puffs into the lungs every 6 (six) hours as needed.        Marland Kitchen albuterol (PROVENTIL) (2.5 MG/3ML) 0.083% nebulizer solution Use 2 - 3 times daily Pt takes as least once daily       No facility-administered medications prior to visit.        Review of Systems  Constitutional:   No  weight loss, night sweats,  Fevers, chills, + fatigue, or  lassitude.  HEENT:   No headaches,  Difficulty swallowing,  Tooth/dental problems, or  Sore throat,                No sneezing, itching, ear ache,  +nasal congestion, post nasal drip,   CV:  No chest pain,  Orthopnea, PND,  anasarca, dizziness, palpitations, syncope.   GI  No heartburn, indigestion, abdominal pain, nausea, vomiting, diarrhea, change in bowel habits, loss of appetite, bloody stools.   Resp:    No chest wall deformity  Skin: no rash or lesions.  GU: no dysuria, change in color of urine, no urgency or frequency.  No flank pain, no hematuria   MS:  No joint pain or swelling.  No decreased range of motion.  No back pain.  Psych:  No change in mood or affect. No depression or anxiety.  No memory loss.     Objective:   Physical Exam BP 118/88  Pulse 61  Temp(Src) 98.3 F (36.8 C) (Oral)  Ht 5\' 5"  (1.651 m)  Wt 208 lb 8 oz (94.575 kg)  BMI 34.7 kg/m2  SpO2 96%  GEN: A/Ox3; pleasant , NAD chronically ill appearing   HEENT:  [place]/AT,  EACs-clear, TMs-wnl, NOSE-clear, THROAT-clear, no lesions, no postnasal drip or exudate noted.   NECK:  Supple w/ fair ROM; no JVD; normal carotid impulses w/o bruits; no thyromegaly or nodules palpated; no lymphadenopathy.  RESP  Diminshed BS in bases no accessory muscle  use, no dullness to  percussion  CARD:  RRR, no m/r/g  , tr peripheral edema, pulses intact, no cyanosis or clubbing.  GI:   Soft & nt; nml bowel sounds; no organomegaly or masses detected.  Musco: Warm bil, no deformities or joint swelling noted.   Neuro: alert, no focal deficits noted.    Skin: Warm, no lesions or rashes         Assessment & Plan:   COPD (chronic obstructive pulmonary disease) with emphysema, [place] C Stop hydrochlorthiazide Increase lasix to one twice daily Finish prednisone Use oxygen all the time Stay on nebulizer medications LAbs today Return 2 months    Updated Medication List Outpatient Encounter Prescriptions as of 09/09/2012  Medication Sig Dispense Refill  . ALPRAZolam (XANAX) 0.5 MG tablet Take 1 mg by mouth at bedtime.       Marland Kitchen arformoterol (BROVANA) 15 MCG/2ML NEBU Take 15 mcg by nebulization 2 (two) times daily. DX:  496 Pt takes this when he can      . budesonide (PULMICORT) 0.25 MG/2ML nebulizer solution Take 2 mLs (0.25 mg total) by nebulization 2 (two) times daily. DX:  496 FILE UNDER MEDICARE PART B  120 mL  3  . clobetasol cream (TEMOVATE) 0.05 % as needed.       . clotrimazole-betamethasone (LOTRISONE) cream as needed.      . digoxin (LANOXIN) 0.25 MG tablet as needed.      . furosemide (LASIX) 40 MG tablet Take 1 tablet (40 mg total) by mouth daily. One twice daily  60 tablet  6  . HYDROcodone-acetaminophen (NORCO) 10-325 MG per tablet Take 1 tablet by mouth 3 (three) times daily as needed for pain.      . nitroGLYCERIN (NITROSTAT) 0.4 MG SL tablet Place 0.4 mg under the tongue every 5 (five) minutes as needed.        . predniSONE (DELTASONE) 10 MG tablet Take 4 tabs  daily with food x 4 days, then 3 tabs daily x 4 days, then 2 tabs daily x 4 days, then 1 tab daily x4 days then stop. #40  40 tablet  0  . tiotropium (SPIRIVA HANDIHALER) 18 MCG inhalation capsule PLACE 1 CAPSULE (18 MCG TOTAL) INTO INHALER AND INHALE DAILY.  90 capsule  3  . [DISCONTINUED]  furosemide (LASIX) 40 MG tablet Take once a day x 2 weeks then as needed  30 tablet  0  . [DISCONTINUED] furosemide (LASIX) 40 MG tablet Take 40 mg by mouth daily.      . [DISCONTINUED] hydrochlorothiazide (HYDRODIURIL) 25 MG tablet Take 25 mg by mouth daily as needed.        . [DISCONTINUED] SPIRIVA HANDIHALER 18 MCG inhalation capsule PLACE 1 CAPSULE (18 MCG TOTAL) INTO INHALER AND INHALE DAILY.  30 capsule  6  . albuterol (PROAIR HFA) 108 (90 BASE) MCG/ACT inhaler Inhale 2 puffs into the lungs every 6 (six) hours as needed.        Marland Kitchen albuterol (PROVENTIL) (2.5 MG/3ML) 0.083% nebulizer solution Use 2 - 3 times daily Pt takes as least once daily       No facility-administered encounter medications on file as of 09/09/2012.

## 2012-09-10 ENCOUNTER — Telehealth: Payer: Self-pay | Admitting: *Deleted

## 2012-09-10 LAB — BASIC METABOLIC PANEL
BUN: 37 mg/dL — ABNORMAL HIGH (ref 6–23)
Calcium: 9.6 mg/dL (ref 8.4–10.5)
Creat: 0.92 mg/dL (ref 0.50–1.35)
Glucose, Bld: 120 mg/dL — ABNORMAL HIGH (ref 70–99)
Potassium: 4.1 mEq/L (ref 3.5–5.3)

## 2012-09-10 NOTE — Telephone Encounter (Signed)
Spiriva pt assistance form along with financial documents left during OV yesterday. I have faxed this back to the [employer] pt assistance program along with Spiriva Rx. Forms placed in Libby's office with the Pt assistance forms. Pt and wife aware this has been done. They will call back in a few weeks if they do not hear anything regarding the status of this.

## 2012-09-10 NOTE — Assessment & Plan Note (Signed)
Stop hydrochlorthiazide Increase lasix to one twice daily Finish prednisone Use oxygen all the time Stay on nebulizer medications LAbs today Return 2 months

## 2012-09-10 NOTE — Progress Notes (Signed)
Quick Note:  Call pt and tell his labs are ok, No change in medications Stay on lasix as ordered. No need for any potassium supp ______

## 2012-09-13 ENCOUNTER — Telehealth: Payer: Self-pay | Admitting: *Deleted

## 2012-09-13 NOTE — Telephone Encounter (Signed)
NOted

## 2012-09-13 NOTE — Progress Notes (Signed)
Quick Note:  Spoke with pt. Informed him of lab results and recs per Dr. Delford Field. He verbalized understanding. ______

## 2012-09-13 NOTE — Telephone Encounter (Signed)
Called, spoke with pt to inform him of lab results and recs per Dr. Delford Field:  Result Note    Call pt and tell his labs are ok, No change in medications   Stay on lasix as ordered. No need for any potassium supp   --------  I spoke with pt and informed him of above results and recs per Dr. Delford Field.  Pt states he was also advised by Dr. Delford Field to take the digoxin 0.25 mg once daily along with the increase in lasix to bid as both would help with the edema in his legs.  Pt states he has been following along with both recs per last OV.  Despite this, is still having edema in legs.  Reports this "hasn't helped one bit."  Advised considering no relief from these recs, he should contact PCP.  He was in agreement with this recs, stated he will call PCP today, and will call back if no assistance received from PCP on this matter.  Will sign off and route to PW as FYI.

## 2012-09-27 ENCOUNTER — Telehealth: Payer: Self-pay | Admitting: Critical Care Medicine

## 2012-09-27 NOTE — Telephone Encounter (Signed)
Spoke with patient-- Patient reports he has been having A LOT of pain in both hips, right more than left States he has spoken with several MDs including his PCP and he feels no one is listening to him about the severity of his pain Patient is requesting Dr. Delford Field refer him to an orthopedic MD/surgeon to see about hip surgery. Dr. Delford Field please advise, thank you!  Last OV:09/09/12 w 2 month f./u not scheduled

## 2012-09-27 NOTE — Telephone Encounter (Signed)
Spoke with pt and notified of recs per PW He verbalized understanding and states nothing further needed

## 2012-09-27 NOTE — Telephone Encounter (Signed)
He needs to get his PCP to do this service for him

## 2012-10-13 ENCOUNTER — Ambulatory Visit (INDEPENDENT_AMBULATORY_CARE_PROVIDER_SITE_OTHER): Payer: Medicare Other | Admitting: Emergency Medicine

## 2012-10-13 ENCOUNTER — Encounter: Payer: Self-pay | Admitting: Emergency Medicine

## 2012-10-13 ENCOUNTER — Telehealth: Payer: Self-pay | Admitting: Critical Care Medicine

## 2012-10-13 VITALS — BP 102/60 | HR 106 | Temp 99.7°F | Ht 63.0 in | Wt 210.0 lb

## 2012-10-13 DIAGNOSIS — J438 Other emphysema: Secondary | ICD-10-CM

## 2012-10-13 DIAGNOSIS — J439 Emphysema, unspecified: Secondary | ICD-10-CM

## 2012-10-13 MED ORDER — ALBUTEROL SULFATE HFA 108 (90 BASE) MCG/ACT IN AERS: 2.0000 | INHALATION_SPRAY | RESPIRATORY_TRACT | Status: AC | PRN

## 2012-10-13 MED ORDER — AEROCHAMBER MV MISC: Status: AC

## 2012-10-13 MED ORDER — ALBUTEROL SULFATE HFA 108 (90 BASE) MCG/ACT IN AERS: 2.0000 | INHALATION_SPRAY | RESPIRATORY_TRACT | Status: DC | PRN

## 2012-10-13 MED ORDER — AEROCHAMBER MV MISC: Status: DC

## 2012-10-13 NOTE — Addendum Note (Signed)
Addended by: Orma Flaming D on: 10/13/2012 04:32 PM   Modules accepted: Orders

## 2012-10-13 NOTE — Telephone Encounter (Signed)
noted 

## 2012-10-13 NOTE — Patient Instructions (Addendum)
Please change your oxygen setup so that your are using the Continuous Flow Concentrator on 3L/min for sleep. (Do not used Pulsed Flow because you will not get oxygen with every breath while you are sleeping).   Your scheduled inhaled medications are:  - Brovana twice a day - Budesonide (Pulmicort) twice a day - Spiriva once a day  Your rescue inhaled medication is:  - Albuterol, to be used up to every 4 hours if needed for shortness of breath. This medications comes in two forms, either a nebulizer or a handheld inhaler. Use the nebulizer when at home, and use 2 puffs of the inhaler when you are away from home (and the nebulizer is not available).  Please follow with Dr Delford Field in 1 month.

## 2012-10-13 NOTE — Progress Notes (Signed)
Subjective:    Patient ID: Joe Rosario, male    DOB: 08-06-1940, 72 y.o.   MRN: [id number]  HPI 72 yo male with known hx of COPD  Previous hx of Lung Cancer -LUL mass in 10/2009 s/p chemo /rad   3/17 /2014 Follow up  Does feel breathing is not as good for last 2 weeks.  More DOE w/ minimal activity, some mild wheezing, dry cough. Does have some coughing fits at times. -this seems to be chronic . No fever or discolored mucus .  Some leg swelling , worse in evening.  Not taking nebs /spiriva over last 2 weeks.  Restarted last few days on nebs /inhaler.   72 yo male with known hx of COPD , fev1 39% 7/13 Previous hx of Lung Cancer -LUL mass in 10/2009 s/p chemo /rad   08/13/2012   More DOE w/ minimal activity, some mild wheezing, dry cough. Does have some coughing fits at times. -this seems to be chronic . No fever or discolored mucus .  Some leg swelling , worse in evening.  Not taking nebs /spiriva over last 2 weeks.  Restarted last few days on nebs /inhaler. >> extra lasix, pred taper  09/03/2012 Pw pt. c/o increase SOB w/ exertion. C/o cough w/ clear phlem, wheezing. Pt uses 3-4 liters O2. Pt wants to know if he needs ot be using the albuterol neb.  CXR 07/2012 chronic changes He started on lasix yesterday for pedal edema Gets O2 from [facility] He is confused about his nebs - takes budesonide once/daily Complains about expense  09/09/2012 Pt continues to be dyspneic, has edema in feet. Pt notes cough. No chest pain. No wheeze.  Mucus is minimal. Pt here for f/u after seeing NP. Pt confused on meds  10/13/12 -- Acute OV.  72 yo man, hx COPD (very severe AFL, 7/'13), prior LUL lung CA by FOB 2011 ([facility]) s/p chemo/XRT, HTN, DJD/DDD, psoriasis. Supposed to be on Brovana/Pulmicort, Spiriva, albuterol prn.  Reports that last night he was awakened acutely, developed acute dyspnea while he was in bed. May have coughed some, not sure if he wheezed. He slept on O2 tank w flow down to 2.5, pulsed  flow. He recovered when he got on O2 via concentrator. He doesn't understand his medications or which nebulizers to use prn.     PULMONARY FUNCTON TEST 12/29/2011  FVC 2.1  FEV1 .93  FEV1/FVC 44.3  FVC  % Predicted 59  FEV % Predicted 39  FeF 25-75 .28  FeF 25-75 % Predicted 12     Past Medical History  Diagnosis Date  . DDD (degenerative disc disease), lumbosacral   . Psoriasis   . Tachycardia   . Irritable colon   . Unspecified essential hypertension   . Chronic sinusitis   . Lumbosacral spondylosis without myelopathy   . Anxiety   . Cervical disc displacement   . COPD (chronic obstructive pulmonary disease)   . Malignant neoplasm of bronchus and lung, unspecified site   . Bladder cancer   . Squamous cell carcinoma of scalp      Family History  Problem Relation Age of Onset  . Heart attack Mother   . Leukemia Brother      History   Social History  . Marital Status: Married    Spouse Name: N/A    Number of Children: 6  . Years of Education: N/A   Occupational History  . retired     Metal work for [employer]  Social History Main Topics  . Smoking status: Former Smoker -- 1.00 packs/day for 30 years    Types: Cigarettes    Quit date: 03/27/2010  . Smokeless tobacco: Former Neurosurgeon    Types: Chew     Comment: 2 years  . Alcohol Use: Yes     Comment: takes whisky for pain  . Drug Use: No  . Sexually Active: Not on file   Other Topics Concern  . Not on file   Social History Narrative  . No narrative on file     No Known Allergies   Outpatient Prescriptions Prior to Visit  Medication Sig Dispense Refill  . albuterol (PROVENTIL) (2.5 MG/3ML) 0.083% nebulizer solution Use 2 - 3 times daily Pt takes as least once daily      . ALPRAZolam (XANAX) 0.5 MG tablet Take 1 mg by mouth at bedtime.       Marland Kitchen arformoterol (BROVANA) 15 MCG/2ML NEBU Take 15 mcg by nebulization 2 (two) times daily. DX:  496 Pt takes this when he can      . budesonide (PULMICORT) 0.25  MG/2ML nebulizer solution Take 2 mLs (0.25 mg total) by nebulization 2 (two) times daily. DX:  496 FILE UNDER MEDICARE PART B  120 mL  3  . clobetasol cream (TEMOVATE) 0.05 % as needed.       . clotrimazole-betamethasone (LOTRISONE) cream as needed.      . digoxin (LANOXIN) 0.25 MG tablet as needed.      . furosemide (LASIX) 40 MG tablet Take 1 tablet (40 mg total) by mouth daily. One twice daily  60 tablet  6  . HYDROcodone-acetaminophen (NORCO) 10-325 MG per tablet Take 1 tablet by mouth 3 (three) times daily as needed for pain.      . nitroGLYCERIN (NITROSTAT) 0.4 MG SL tablet Place 0.4 mg under the tongue every 5 (five) minutes as needed.        . tiotropium (SPIRIVA HANDIHALER) 18 MCG inhalation capsule PLACE 1 CAPSULE (18 MCG TOTAL) INTO INHALER AND INHALE DAILY.  90 capsule  3  . albuterol (PROAIR HFA) 108 (90 BASE) MCG/ACT inhaler Inhale 2 puffs into the lungs every 6 (six) hours as needed.        . predniSONE (DELTASONE) 10 MG tablet Take 4 tabs  daily with food x 4 days, then 3 tabs daily x 4 days, then 2 tabs daily x 4 days, then 1 tab daily x4 days then stop. #40  40 tablet  0   No facility-administered medications prior to visit.    Review of Systems Constitutional:   No  weight loss, night sweats,  Fevers, chills, + fatigue, or  lassitude.  HEENT:   No headaches,  Difficulty swallowing,  Tooth/dental problems, or  Sore throat,                No sneezing, itching, ear ache,  +nasal congestion, post nasal drip,   CV:  No chest pain,  Orthopnea, PND,  anasarca, dizziness, palpitations, syncope.   GI  No heartburn, indigestion, abdominal pain, nausea, vomiting, diarrhea, change in bowel habits, loss of appetite, bloody stools.   Resp:    No chest wall deformity  Skin: no rash or lesions.  GU: no dysuria, change in color of urine, no urgency or frequency.  No flank pain, no hematuria   MS:  No joint pain or swelling.  No decreased range of motion.  No back pain.  Psych:  No  change in mood  or affect. No depression or anxiety.  No memory loss.     Objective:   Physical ExamBP 102/60  Pulse 106  Temp(Src) 99.7 F (37.6 C) (Oral)  Ht 5\' 3"  (1.6 m)  Wt 210 lb (95.255 kg)  BMI 37.21 kg/m2  SpO2 96%  GEN: A/Ox3; pleasant , NAD chronically ill appearing   HEENT:  [place]/AT,  EACs-clear, TMs-wnl, NOSE-clear, THROAT-clear, no lesions, no postnasal drip or exudate noted.   NECK:  Supple w/ fair ROM; no JVD; normal carotid impulses w/o bruits; no thyromegaly or nodules palpated; no lymphadenopathy.  RESP  Diminshed BS in bases no accessory muscle use, no dullness to percussion  CARD:  RRR, no m/r/g  , tr peripheral edema, pulses intact, no cyanosis or clubbing.  GI:   Soft & nt; nml bowel sounds; no organomegaly or masses detected.  Musco: Warm bil, no deformities or joint swelling noted.   Neuro: alert, no focal deficits noted.    Skin: Warm, no lesions or rashes      Assessment & Plan:   COPD (chronic obstructive pulmonary disease) with emphysema, Gold C Acute dyspnea woke him from sleep. The rapid improvement when he changed to concentrator suggests that this could have been ineffective flow from his tank (not sure why he is using tanks to sleep...). Of course, could have been other etiology - GERD, mucous plugging, etc. He does not appear exacerbated. Regardless, he shouldn't sleep on tank, especially on pulsed flow. Suspect he undertreats this way.  - instructed him to use concentrator on continuous flow at night - again wrote down the medications he is supposed to be on based on dr [employer] notes. He was off Spiriva, was unsure which meds were for rescue, etc.  - restart spiriva - rov 1 month with PW    Updated Medication List Outpatient Encounter Prescriptions as of 10/13/2012  Medication Sig Dispense Refill  . albuterol (PROVENTIL) (2.5 MG/3ML) 0.083% nebulizer solution Use 2 - 3 times daily Pt takes as least once daily      . ALPRAZolam (XANAX)  0.5 MG tablet Take 1 mg by mouth at bedtime.       Marland Kitchen arformoterol (BROVANA) 15 MCG/2ML NEBU Take 15 mcg by nebulization 2 (two) times daily. DX:  496 Pt takes this when he can      . budesonide (PULMICORT) 0.25 MG/2ML nebulizer solution Take 2 mLs (0.25 mg total) by nebulization 2 (two) times daily. DX:  496 FILE UNDER MEDICARE PART B  120 mL  3  . clobetasol cream (TEMOVATE) 0.05 % as needed.       . clotrimazole-betamethasone (LOTRISONE) cream as needed.      . digoxin (LANOXIN) 0.25 MG tablet as needed.      . furosemide (LASIX) 40 MG tablet Take 1 tablet (40 mg total) by mouth daily. One twice daily  60 tablet  6  . HYDROcodone-acetaminophen (NORCO) 10-325 MG per tablet Take 1 tablet by mouth 3 (three) times daily as needed for pain.      . nitroGLYCERIN (NITROSTAT) 0.4 MG SL tablet Place 0.4 mg under the tongue every 5 (five) minutes as needed.        . tiotropium (SPIRIVA HANDIHALER) 18 MCG inhalation capsule PLACE 1 CAPSULE (18 MCG TOTAL) INTO INHALER AND INHALE DAILY.  90 capsule  3  . albuterol (PROAIR HFA) 108 (90 BASE) MCG/ACT inhaler Inhale 2 puffs into the lungs every 6 (six) hours as needed.        . [  DISCONTINUED] predniSONE (DELTASONE) 10 MG tablet Take 4 tabs  daily with food x 4 days, then 3 tabs daily x 4 days, then 2 tabs daily x 4 days, then 1 tab daily x4 days then stop. #40  40 tablet  0   No facility-administered encounter medications on file as of 10/13/2012.

## 2012-10-13 NOTE — Telephone Encounter (Signed)
I spoke with Joe Rosario and he stated he is struggling to breathe. I scheduled Joe Rosario to come in and see BR today at 3:15 for acute visit. Will forward to Dr. Delford Field so he is aware.

## 2012-10-13 NOTE — Assessment & Plan Note (Signed)
Acute dyspnea woke him from sleep. The rapid improvement when he changed to concentrator suggests that this could have been ineffective flow from his tank (not sure why he is using tanks to sleep...). Of course, could have been other etiology - GERD, mucous plugging, etc. He does not appear exacerbated. Regardless, he shouldn't sleep on tank, especially on pulsed flow. Suspect he undertreats this way.  - instructed him to use concentrator on continuous flow at night - again wrote down the medications he is supposed to be on based on dr [employer] notes. He was off Spiriva, was unsure which meds were for rescue, etc.  - restart spiriva - rov 1 month with PW

## 2012-10-13 NOTE — Addendum Note (Signed)
Addended by: Orma Flaming D on: 10/13/2012 05:47 PM   Modules accepted: Orders

## 2012-11-16 ENCOUNTER — Encounter: Payer: Self-pay | Admitting: Critical Care Medicine

## 2012-11-16 ENCOUNTER — Ambulatory Visit (INDEPENDENT_AMBULATORY_CARE_PROVIDER_SITE_OTHER): Payer: Medicare Other | Admitting: Critical Care Medicine

## 2012-11-16 VITALS — BP 124/80 | HR 116 | Temp 98.7°F | Ht 64.0 in | Wt 205.0 lb

## 2012-11-16 DIAGNOSIS — J438 Other emphysema: Secondary | ICD-10-CM

## 2012-11-16 DIAGNOSIS — J439 Emphysema, unspecified: Secondary | ICD-10-CM

## 2012-11-16 MED ORDER — ALBUTEROL SULFATE (2.5 MG/3ML) 0.083% IN NEBU: INHALATION_SOLUTION | RESPIRATORY_TRACT | Status: AC

## 2012-11-16 MED ORDER — PREDNISONE 10 MG PO TABS: ORAL_TABLET | ORAL | Status: DC

## 2012-11-16 NOTE — Patient Instructions (Addendum)
Prednisone 10mg  Take 4 for three days 3 for three days 2 for three days 1 for three days and stop Use albuterol as needed No other medication changes Consider hospice/palliative care referral, call us with your decision Return  2 months

## 2012-11-16 NOTE — Assessment & Plan Note (Addendum)
Chronic obstructive lung disease gold stage C. with chronic respiratory failure nearing end-stage Plan I had a long discussion with the patient and his spouse regarding the possibility of hospice referral he is taking this under advisement at this time is not willing to make yet this decision  Plan Pulse prednisone Other medications as prescribed

## 2012-11-16 NOTE — Progress Notes (Signed)
Subjective:    Patient ID: Joe Rosario, male    DOB: [birth year], 72 y.o.   MRN: [id number]  HPI  72 yo male with known hx of COPD  Previous hx of Lung Cancer -LUL mass in 10/2009 s/p chemo /rad   11/16/2012 Pt saw Rb at prior OV The patient's main issues continue be that of severe shortness of breath and productive cough. The patient notes ongoing chest discomfort and bilateral hip pain chronic orthopedic issues. The patient notes ongoing wheezing. The patient has been advised to consider hospice but is wishing to hold off on this at this time. Patient notes when prednisone was increased his dyspnea does improve. At the last visit the patient had some confusion as to his maintenance versus scheduled and as needed medications     Past Medical History  Diagnosis Date  . DDD (degenerative disc disease), lumbosacral   . Psoriasis   . Tachycardia   . Irritable colon   . Unspecified essential hypertension   . Chronic sinusitis   . Lumbosacral spondylosis without myelopathy   . Anxiety   . Cervical disc displacement   . COPD (chronic obstructive pulmonary disease)   . Malignant neoplasm of bronchus and lung, unspecified site   . Bladder cancer   . Squamous cell carcinoma of scalp      Family History  Problem Relation Age of Onset  . Heart attack Mother   . Leukemia Brother      History   Social History  . Marital Status: Married    Spouse Name: N/A    Number of Children: 6  . Years of Education: N/A   Occupational History  . retired     Metal work for [employer]   Social History Main Topics  . Smoking status: Former Smoker -- 1.00 packs/day for 30 years    Types: Cigarettes    Quit date: 03/27/2010  . Smokeless tobacco: Former Neurosurgeon    Types: Chew     Comment: 2 years  . Alcohol Use: Yes     Comment: takes whisky for pain  . Drug Use: No  . Sexually Active: Not on file   Other Topics Concern  . Not on file   Social History Narrative  . No narrative on file      No Known Allergies   Outpatient Prescriptions Prior to Visit  Medication Sig Dispense Refill  . ALPRAZolam (XANAX) 0.5 MG tablet Take 1 mg by mouth at bedtime.       Marland Kitchen arformoterol (BROVANA) 15 MCG/2ML NEBU Take 15 mcg by nebulization 2 (two) times daily. DX:  496      . budesonide (PULMICORT) 0.25 MG/2ML nebulizer solution Take 2 mLs (0.25 mg total) by nebulization 2 (two) times daily. DX:  496 FILE UNDER MEDICARE PART B  120 mL  3  . clobetasol cream (TEMOVATE) 0.05 % as needed.       . clotrimazole-betamethasone (LOTRISONE) cream as needed.      . digoxin (LANOXIN) 0.25 MG tablet Take 0.25 mg by mouth daily.       Marland Kitchen HYDROcodone-acetaminophen (NORCO) 10-325 MG per tablet Take 1 tablet by mouth 3 (three) times daily as needed for pain.      . nitroGLYCERIN (NITROSTAT) 0.4 MG SL tablet Place 0.4 mg under the tongue every 5 (five) minutes as needed.        Marland Kitchen Spacer/Aero-Holding Chambers (AEROCHAMBER MV) inhaler Use as instructed  1 each  0  . tiotropium (SPIRIVA HANDIHALER)  18 MCG inhalation capsule PLACE 1 CAPSULE (18 MCG TOTAL) INTO INHALER AND INHALE DAILY.  90 capsule  3  . albuterol (PROVENTIL) (2.5 MG/3ML) 0.083% nebulizer solution Use 2 - 3 times daily Pt takes as least once daily      . furosemide (LASIX) 40 MG tablet Take 1 tablet (40 mg total) by mouth daily. One twice daily  60 tablet  6  . albuterol (PROAIR HFA) 108 (90 BASE) MCG/ACT inhaler Inhale 2 puffs into the lungs every 4 (four) hours as needed for wheezing or shortness of breath.  1 Inhaler  3   No facility-administered medications prior to visit.    Review of Systems  Constitutional:   No  weight loss, night sweats,  Fevers, chills, + fatigue, or  lassitude.  HEENT:   No headaches,  Difficulty swallowing,  Tooth/dental problems, or  Sore throat,                No sneezing, itching, ear ache,  +nasal congestion, post nasal drip,   CV:  No chest pain,  Orthopnea, PND,  anasarca, dizziness, palpitations,  syncope.   GI  No heartburn, indigestion, abdominal pain, nausea, vomiting, diarrhea, change in bowel habits, loss of appetite, bloody stools.   Resp:    No chest wall deformity  Skin: no rash or lesions.  GU: no dysuria, change in color of urine, no urgency or frequency.  No flank pain, no hematuria   MS:  No joint pain or swelling.  No decreased range of motion.  No back pain.  Psych:  No change in mood or affect. No depression or anxiety.  No memory loss.     Objective:   Physical Exam BP 124/80  Pulse 116  Temp(Src) 98.7 F (37.1 C) (Oral)  Ht 5\' 4"  (1.626 m)  Wt 205 lb (92.987 kg)  BMI 35.17 kg/m2  SpO2 95%  GEN: A/Ox3; pleasant , NAD chronically ill appearing   HEENT:  [place]/AT,  EACs-clear, TMs-wnl, NOSE-clear, THROAT-clear, no lesions, no postnasal drip or exudate noted.   NECK:  Supple w/ fair ROM; no JVD; normal carotid impulses w/o bruits; no thyromegaly or nodules palpated; no lymphadenopathy.  RESP  Diminshed BS in bases no accessory muscle use, no dullness to percussion  CARD:  RRR, no m/r/g  , tr peripheral edema, pulses intact, no cyanosis or clubbing.  GI:   Soft & nt; nml bowel sounds; no organomegaly or masses detected.  Musco: Warm bil, no deformities or joint swelling noted.   Neuro: alert, no focal deficits noted.    Skin: Warm, no lesions or rashes      Assessment & Plan:   COPD (chronic obstructive pulmonary disease) with emphysema, Gold C Chronic obstructive lung disease gold stage C. with chronic respiratory failure nearing end-stage Plan I had a long discussion with the patient and his spouse regarding the possibility of hospice referral he is taking this under advisement at this time is not willing to make yet this decision  Plan Pulse prednisone Other medications as prescribed    Updated Medication List Outpatient Encounter Prescriptions as of 11/16/2012  Medication Sig Dispense Refill  . albuterol (PROVENTIL) (2.5 MG/3ML) 0.083%  nebulizer solution Use 2 - 3 times daily as needed  75 mL    . ALPRAZolam (XANAX) 0.5 MG tablet Take 1 mg by mouth at bedtime.       Marland Kitchen arformoterol (BROVANA) 15 MCG/2ML NEBU Take 15 mcg by nebulization 2 (two) times daily. DX:  496      .  budesonide (PULMICORT) 0.25 MG/2ML nebulizer solution Take 2 mLs (0.25 mg total) by nebulization 2 (two) times daily. DX:  496 FILE UNDER MEDICARE PART B  120 mL  3  . clobetasol cream (TEMOVATE) 0.05 % as needed.       . clotrimazole-betamethasone (LOTRISONE) cream as needed.      . digoxin (LANOXIN) 0.25 MG tablet Take 0.25 mg by mouth daily.       . furosemide (LASIX) 40 MG tablet Take 40 mg by mouth 2 (two) times daily.      Marland Kitchen HYDROcodone-acetaminophen (NORCO) 10-325 MG per tablet Take 1 tablet by mouth 3 (three) times daily as needed for pain.      . nitroGLYCERIN (NITROSTAT) 0.4 MG SL tablet Place 0.4 mg under the tongue every 5 (five) minutes as needed.        Marland Kitchen Spacer/Aero-Holding Chambers (AEROCHAMBER MV) inhaler Use as instructed  1 each  0  . tiotropium (SPIRIVA HANDIHALER) 18 MCG inhalation capsule PLACE 1 CAPSULE (18 MCG TOTAL) INTO INHALER AND INHALE DAILY.  90 capsule  3  . [DISCONTINUED] albuterol (PROVENTIL) (2.5 MG/3ML) 0.083% nebulizer solution Use 2 - 3 times daily Pt takes as least once daily      . [DISCONTINUED] furosemide (LASIX) 40 MG tablet Take 1 tablet (40 mg total) by mouth daily. One twice daily  60 tablet  6  . albuterol (PROAIR HFA) 108 (90 BASE) MCG/ACT inhaler Inhale 2 puffs into the lungs every 4 (four) hours as needed for wheezing or shortness of breath.  1 Inhaler  3  . predniSONE (DELTASONE) 10 MG tablet Take 4 for three days 3 for three days 2 for three days 1 for three days and stop  30 tablet  0   No facility-administered encounter medications on file as of 11/16/2012.

## 2012-11-17 ENCOUNTER — Telehealth: Payer: Self-pay | Admitting: Critical Care Medicine

## 2012-11-17 DIAGNOSIS — J439 Emphysema, unspecified: Secondary | ICD-10-CM

## 2012-11-17 NOTE — Telephone Encounter (Signed)
Hospice referral was faxed to [facility] so refaxed to #given this time Joe Rosario

## 2012-11-17 NOTE — Telephone Encounter (Signed)
Spoke with pt an explained Hospice/Palliative Care referral and he states that he is ready to start this.  Referral Sent to [facility] - will forward to Dr Delford Field to make him aware.

## 2012-11-17 NOTE — Telephone Encounter (Signed)
Also let hospice of [place] know i spoke to pts pcp Merrily Pew who will be primary for hospice

## 2012-11-17 NOTE — Telephone Encounter (Signed)
I called and made hospice aware of this. Nothing further was needed

## 2012-11-17 NOTE — Telephone Encounter (Signed)
    Storm Frisk, MD at 11/17/2012 12:01 PM    Status: Signed             Also let hospice of [place] know i spoke to pts pcp Merrily Pew who will be primary for hospice   I called and made vicky aware of this. I advised her we had called earlier and advised someone of this earlier.. She voiced her understanding and needed nothing further

## 2012-11-18 ENCOUNTER — Telehealth: Payer: Self-pay | Admitting: Critical Care Medicine

## 2012-11-18 NOTE — Telephone Encounter (Signed)
I spoke with [facility]. Pt is admitted to hospice of [place] as of this morning. This is just [facility] for [place]. I advised her we were already aware he was going to be in hospice bc we placed referral and per PW yesterday pt PCP was going to be primary for hospice. She voiced her understanding and needed nothing further

## 2012-11-18 NOTE — Telephone Encounter (Signed)
lmomtcb for wendy 

## 2012-11-18 NOTE — Telephone Encounter (Signed)
Spoke with Toniann Fail with Hospice.  She is calling to let us know pt was admitted to hospice today.  Pt c/o 13/10 pain in bilateral hips and back pain, Using hydrocodone/apap 10/325 qid which is not touching pain.  Toniann Fail states she has Dr. Delford Field listed as Primary for hospice.  She's calling to see if Dr. Delford Field would like to manage pt's pain or have hospice mds assist with this.  However, per previous phone msgs, Dr. Delford Field has spoken with Dr. Tomasa Blase who has agreed to be Hospice primary and hospice was notified of this by our office. Toniann Fail states she will investigate this further.  She will call Dr. Tomasa Blase office and is aware to call our office back if she gets a different msg or no response from Dr. Tomasa Blase office or if anything further is needed so we can ensure pt is properly taken care of.  Toniann Fail verbalized understand and voiced no further questions or concerns at this time.

## 2012-11-22 ENCOUNTER — Telehealth: Payer: Self-pay | Admitting: Critical Care Medicine

## 2012-11-22 NOTE — Telephone Encounter (Signed)
Spoke with pt's Hospice RN, Toniann Fail She states calling to let PW know (per Hospice protocol) that brovana, pulmicort and spiriva are not on the Hospice formulary  She states some drugs that are are advair and qvar She is wondering if PW would like to change any of his meds Please advise thanks!

## 2012-11-22 NOTE — Telephone Encounter (Signed)
Need to stay on these meds for now

## 2012-11-22 NOTE — Telephone Encounter (Signed)
Spoke with Wendi and notified of recs per PW She verbalized understanding and states nothing further needed

## 2012-11-25 ENCOUNTER — Telehealth: Payer: Self-pay | Admitting: Critical Care Medicine

## 2012-11-25 NOTE — Telephone Encounter (Signed)
Spoke with patient, patient concerned about inhaling plastic when using spiriva. I advised patient that this should not be happening, there is a protector in the device to keep him from inhaling anything but the powder from the capsule. Patient verbalized understanding and nothing further needed at this time

## 2012-12-24 ENCOUNTER — Telehealth: Payer: Self-pay | Admitting: Critical Care Medicine

## 2012-12-24 MED ORDER — PREDNISONE 10 MG PO TABS: ORAL_TABLET | ORAL | Status: AC

## 2012-12-24 MED ORDER — DOXYCYCLINE HYCLATE 100 MG PO TABS: 100.0000 mg | ORAL_TABLET | Freq: Two times a day (BID) | ORAL | Status: AC

## 2012-12-24 NOTE — Telephone Encounter (Signed)
has past surgical history that includes Rotator cuff repair; Appendectomy; Shoulder surgery; Tonsillectomy; Hip fracture surgery; and Abdominal aortic aneurysm repair.    has a past medical history of DDD (degenerative disc disease), lumbosacral; Psoriasis; Tachycardia; Irritable colon; Unspecified essential hypertension; Chronic sinusitis; Lumbosacral spondylosis without myelopathy; Anxiety; Cervical disc displacement; COPD (chronic obstructive pulmonary disease); Malignant neoplasm of bronchus and lung, unspecified site; Bladder cancer; and Squamous cell carcinoma of scalp.   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 Kalman Shan, M.D., [facility].C.P Pulmonary and Critical Care Medicine Staff Physician [facility] Pager: [phone], If no answer or between  15:00h - 7:00h: call [phone]  12/24/2012 3:09 PM

## 2012-12-24 NOTE — Telephone Encounter (Signed)
Called, spoke with pt.  C/o increased SOB "when walking 10 ft" x [redacted] wk along with pain under left ribs and in the right side of back.  Coughing mostly qhs with creamy mucus and has PND.  Using albuterol neb with relief and taking advil congestion relief which helps.  Offered OV today but pt unable to come in reporting he will have to find transportation and "doesn't have the strength to come in."  PW is off this evening.  Per triage protocol, will route msg to doc of the day.  MR, pls advise.  Thank you.  [facility] [place]  No Known Allergies

## 2012-12-24 NOTE — Telephone Encounter (Signed)
I spoke with pt and his spouse and made them aware of MR recs. They also wanted me to call [facility] nurse for this as well. I called wendy [phone] and made her aware. She voiced her understanding. She asked for me to call this in so hospice will pay for it and give the pharmacy the following infromation: RX BIN: [id number]. Group: [id number], ID#: [phone]. I spoke with Lawson Fiscal from [facility] and gave all this information and RX's. Nothing further was needed

## 2013-01-25 ENCOUNTER — Ambulatory Visit: Payer: Medicare Other | Admitting: Critical Care Medicine

## 2013-03-02 DEATH — deceased

## 2013-08-10 ENCOUNTER — Telehealth: Payer: Self-pay | Admitting: Vascular Surgery

## 2013-08-10 NOTE — Telephone Encounter (Signed)
DOD has been confirmed .

## 2013-08-10 NOTE — Telephone Encounter (Signed)
Charlaine Dalton Spouse called tol let us know that hs is deceased and passed on 14-Mar-2013.

## 2013-08-15 ENCOUNTER — Telehealth: Payer: Self-pay | Admitting: Critical Care Medicine

## 2013-08-15 NOTE — Telephone Encounter (Signed)
Will route message to PW so that he will be aware.

## 2013-08-15 NOTE — Telephone Encounter (Signed)
noted 

## 2013-09-01 ENCOUNTER — Ambulatory Visit: Payer: Medicare Other | Admitting: Vascular Surgery

## 2013-09-01 ENCOUNTER — Other Ambulatory Visit (HOSPITAL_COMMUNITY): Payer: Medicare Other

## 2014-01-02 NOTE — Telephone Encounter (Signed)
Patient noted as deceased in [facility]. Joe Rosario

## 2014-05-18 IMAGING — CR DG CHEST 2V
2 series · 2 of 2 positions shown · non-contrast
Comparison: 04/18/2011.  CT 08/14/2011.

CLINICAL DATA: History of coughing, shortness of breath, chest
pain, hypertension, COPD, and history of smoking in the past.

CHEST - 2 VIEW

[view not recorded (1 of 2)]
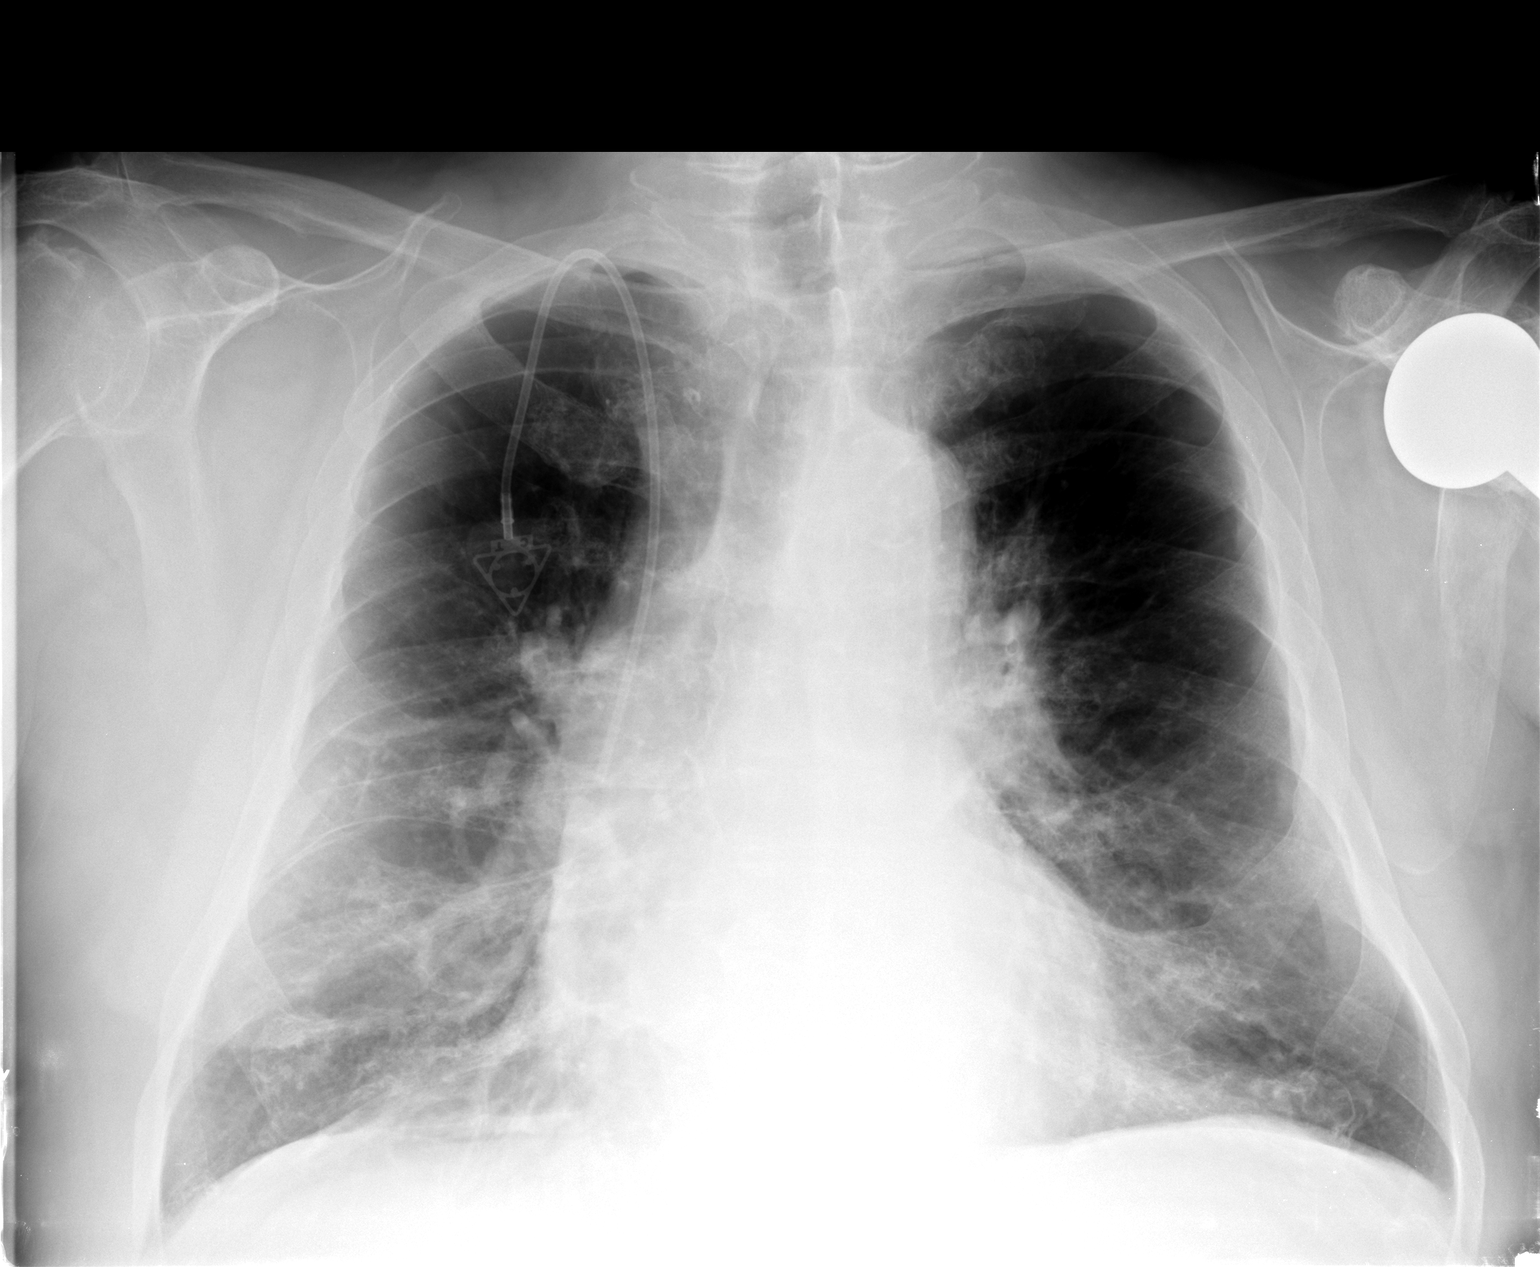

[view not recorded (2 of 2)]
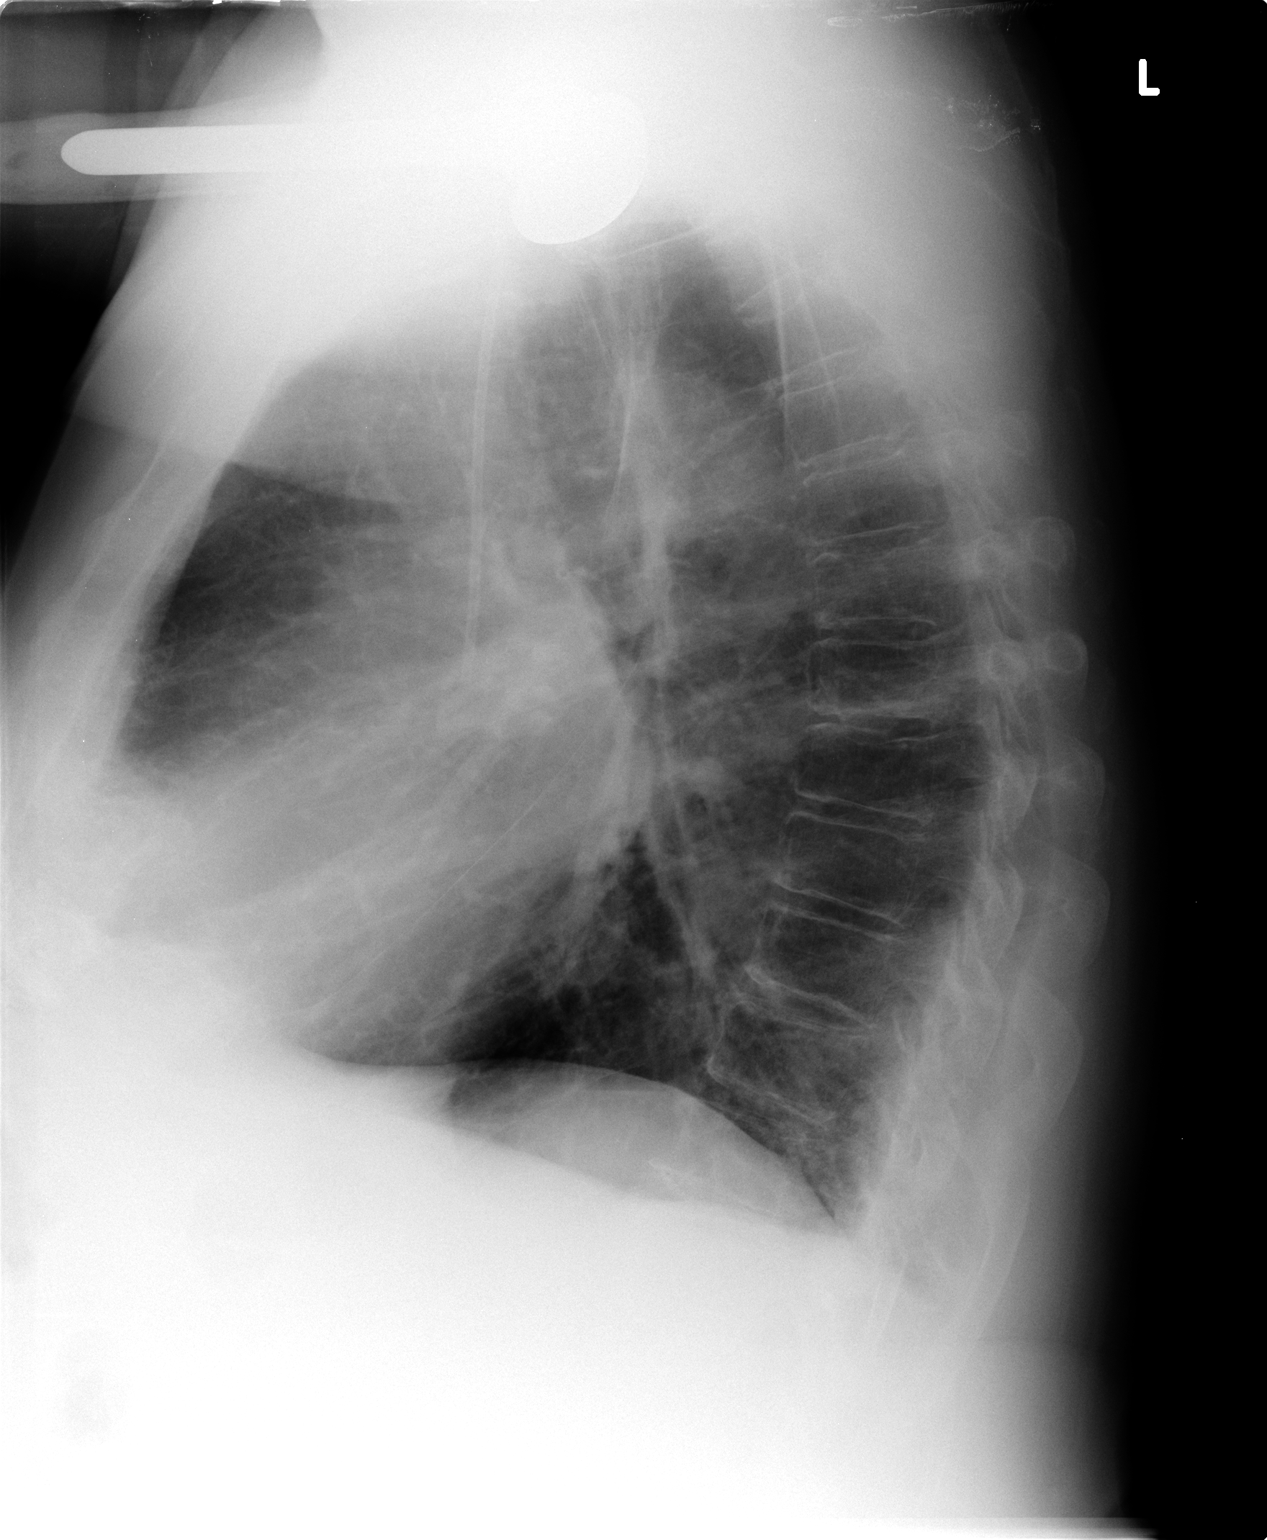

[2 of 2 positions shown; findings below may reference images not displayed]

FINDINGS: Cardiac silhouette is upper normal size. Ectasia and
nonaneurysmal calcification of the thoracic aorta are seen.  Tip of
right-sided power Port-A-Cath terminates in the lower portion of
the superior vena cava.  Stable increased markings are seen in the
lower lungs most likely reflecting fibrosis.  There is generalized
hyperinflation configuration.  There is minimal central
peribronchial thickening.  No pulmonary edema, pneumonia, or
pleural effusion is seen.  There is an osteopenic appearance of the
bones.  Minimal degenerative spondylosis is present. Metallic
humeral prosthesis on the left is partially visualized and appears
unchanged.
IMPRESSION: Port-A-Cath in place.  Stable bilateral basilar increased markings
most likely reflecting fibrosis.  Generalized hyperinflation
configuration consistent with COPD.  Minimal central peribronchial
thickening.  Stable chronic findings are detailed above.
